# Patient Record
Sex: Female | Born: 1980 | Hispanic: No | Marital: Married | Smoking: Never smoker
Health system: Southern US, Community
[De-identification: ages and names within clinical notes are randomized; demographics above are authoritative.]

## PROBLEM LIST (undated history)

## (undated) ENCOUNTER — Emergency Department (HOSPITAL_COMMUNITY): Payer: Self-pay

## (undated) DIAGNOSIS — D179 Benign lipomatous neoplasm, unspecified: Secondary | ICD-10-CM

## (undated) DIAGNOSIS — Z789 Other specified health status: Secondary | ICD-10-CM

## (undated) HISTORY — PX: NO PAST SURGERIES: SHX2092

## (undated) HISTORY — DX: Benign lipomatous neoplasm, unspecified: D17.9

---

## 2012-12-29 ENCOUNTER — Other Ambulatory Visit (HOSPITAL_COMMUNITY): Payer: Self-pay | Admitting: Obstetrics and Gynecology

## 2012-12-29 DIAGNOSIS — Z0489 Encounter for examination and observation for other specified reasons: Secondary | ICD-10-CM

## 2012-12-29 LAB — OB RESULTS CONSOLE ANTIBODY SCREEN: ANTIBODY SCREEN: NEGATIVE

## 2012-12-29 LAB — OB RESULTS CONSOLE RPR: RPR: NONREACTIVE

## 2012-12-29 LAB — OB RESULTS CONSOLE HIV ANTIBODY (ROUTINE TESTING): HIV: NONREACTIVE

## 2012-12-29 LAB — OB RESULTS CONSOLE ABO/RH: RH TYPE: POSITIVE

## 2012-12-29 LAB — OB RESULTS CONSOLE RUBELLA ANTIBODY, IGM: RUBELLA: NON-IMMUNE/NOT IMMUNE

## 2012-12-29 LAB — OB RESULTS CONSOLE GC/CHLAMYDIA
Chlamydia: NEGATIVE
Gonorrhea: NEGATIVE

## 2012-12-29 LAB — OB RESULTS CONSOLE HEPATITIS B SURFACE ANTIGEN: Hepatitis B Surface Ag: NEGATIVE

## 2013-01-26 ENCOUNTER — Other Ambulatory Visit (HOSPITAL_COMMUNITY): Payer: Self-pay | Admitting: Obstetrics and Gynecology

## 2013-01-26 ENCOUNTER — Ambulatory Visit (HOSPITAL_COMMUNITY)
Admission: RE | Admit: 2013-01-26 | Discharge: 2013-01-26 | Disposition: A | Discharge status: Home / Self Care | Payer: Medicaid Other | Source: Ambulatory Visit | Attending: Obstetrics and Gynecology | Admitting: Obstetrics and Gynecology

## 2013-01-26 DIAGNOSIS — Z0489 Encounter for examination and observation for other specified reasons: Secondary | ICD-10-CM

## 2013-01-26 DIAGNOSIS — Z3689 Encounter for other specified antenatal screening: Secondary | ICD-10-CM | POA: Insufficient documentation

## 2013-04-13 NOTE — L&D Delivery Note (Signed)
Delivery Note At 9:07 AM a viable and healthy female was delivered via Vaginal, Spontaneous Delivery (Presentation: Left; Occiput Anterior).  APGAR: 9, 9; weight pending.   Placenta status: Intact, Spontaneous.  Cord: 3 vessels with a loose nuchal x 1  Anesthesia: Epidural  Episiotomy: None Lacerations: 2nd degree perineal Suture Repair: 3.0 vicryl & 4-0 vicryl Est. Blood Loss (mL): 400 cc  Mom to postpartum.  Baby to Couplet care / Skin to Skin.  Feven Alderfer H. 06/26/2013, 9:50 AM

## 2013-05-25 LAB — OB RESULTS CONSOLE GBS: GBS: NEGATIVE

## 2013-06-21 ENCOUNTER — Telehealth (HOSPITAL_COMMUNITY): Payer: Self-pay | Admitting: *Deleted

## 2013-06-21 NOTE — Telephone Encounter (Signed)
Preadmission screen  

## 2013-06-22 ENCOUNTER — Encounter (HOSPITAL_COMMUNITY): Payer: Self-pay | Admitting: *Deleted

## 2013-06-23 ENCOUNTER — Telehealth (HOSPITAL_COMMUNITY): Payer: Self-pay | Admitting: *Deleted

## 2013-06-23 NOTE — Telephone Encounter (Signed)
Preadmission screen Interpreter number  [phone]

## 2013-06-25 ENCOUNTER — Inpatient Hospital Stay (HOSPITAL_COMMUNITY)
Admission: RE | Admit: 2013-06-25 | Discharge: 2013-06-28 | DRG: 775 | Disposition: A | Discharge status: Home / Self Care | Payer: Medicaid Other | Source: Ambulatory Visit | Attending: Obstetrics and Gynecology | Admitting: Obstetrics and Gynecology

## 2013-06-25 ENCOUNTER — Inpatient Hospital Stay (HOSPITAL_COMMUNITY)
Admission: RE | Admit: 2013-06-25 | Discharge: 2013-06-25 | Disposition: A | Discharge status: Home / Self Care | Payer: Medicaid Other | Source: Ambulatory Visit | Attending: Obstetrics and Gynecology | Admitting: Obstetrics and Gynecology

## 2013-06-25 ENCOUNTER — Encounter (HOSPITAL_COMMUNITY): Payer: Self-pay | Admitting: *Deleted

## 2013-06-25 DIAGNOSIS — Z3490 Encounter for supervision of normal pregnancy, unspecified, unspecified trimester: Secondary | ICD-10-CM

## 2013-06-25 DIAGNOSIS — O480 Post-term pregnancy: Principal | ICD-10-CM | POA: Diagnosis present

## 2013-06-25 HISTORY — DX: Other specified health status: Z78.9

## 2013-06-25 LAB — CBC
HEMATOCRIT: 35 % — AB (ref 36.0–46.0)
HEMOGLOBIN: 12.1 g/dL (ref 12.0–15.0)
MCH: 32 pg (ref 26.0–34.0)
MCHC: 34.6 g/dL (ref 30.0–36.0)
MCV: 92.6 fL (ref 78.0–100.0)
Platelets: 208 10*3/uL (ref 150–400)
RBC: 3.78 MIL/uL — ABNORMAL LOW (ref 3.87–5.11)
RDW: 13.2 % (ref 11.5–15.5)
WBC: 13 10*3/uL — AB (ref 4.0–10.5)

## 2013-06-25 MED ORDER — LIDOCAINE HCL (PF) 1 % IJ SOLN
30.0000 mL | INTRAMUSCULAR | Status: DC | PRN
  Filled 2013-06-25: qty 30

## 2013-06-25 MED ORDER — OXYTOCIN 40 UNITS IN LACTATED RINGERS INFUSION - SIMPLE MED: 62.5000 mL/h | INTRAVENOUS | Status: DC

## 2013-06-25 MED ORDER — IBUPROFEN 600 MG PO TABS
600.0000 mg | ORAL_TABLET | Freq: Four times a day (QID) | ORAL | Status: DC | PRN
  Administered 2013-06-26: 600 mg via ORAL
  Filled 2013-06-25: qty 1

## 2013-06-25 MED ORDER — BUTORPHANOL TARTRATE 1 MG/ML IJ SOLN
1.0000 mg | INTRAMUSCULAR | Status: DC | PRN
  Administered 2013-06-26: 1 mg via INTRAVENOUS
  Filled 2013-06-25 (×2): qty 1

## 2013-06-25 MED ORDER — ACETAMINOPHEN 325 MG PO TABS: 650.0000 mg | ORAL_TABLET | ORAL | Status: DC | PRN

## 2013-06-25 MED ORDER — EPHEDRINE 5 MG/ML INJ
10.0000 mg | INTRAVENOUS | Status: DC | PRN
  Filled 2013-06-25: qty 4
  Filled 2013-06-25: qty 2

## 2013-06-25 MED ORDER — CITRIC ACID-SODIUM CITRATE 334-500 MG/5ML PO SOLN: 30.0000 mL | ORAL | Status: DC | PRN

## 2013-06-25 MED ORDER — DIPHENHYDRAMINE HCL 50 MG/ML IJ SOLN: 12.5000 mg | INTRAMUSCULAR | Status: DC | PRN

## 2013-06-25 MED ORDER — ZOLPIDEM TARTRATE 5 MG PO TABS
5.0000 mg | ORAL_TABLET | Freq: Once | ORAL | Status: AC
  Administered 2013-06-25: 5 mg via ORAL
  Filled 2013-06-25: qty 1

## 2013-06-25 MED ORDER — EPHEDRINE 5 MG/ML INJ
10.0000 mg | INTRAVENOUS | Status: DC | PRN
  Filled 2013-06-25: qty 2

## 2013-06-25 MED ORDER — PHENYLEPHRINE 40 MCG/ML (10ML) SYRINGE FOR IV PUSH (FOR BLOOD PRESSURE SUPPORT)
80.0000 ug | PREFILLED_SYRINGE | INTRAVENOUS | Status: DC | PRN
  Filled 2013-06-25: qty 2
  Filled 2013-06-25: qty 10

## 2013-06-25 MED ORDER — PHENYLEPHRINE 40 MCG/ML (10ML) SYRINGE FOR IV PUSH (FOR BLOOD PRESSURE SUPPORT)
80.0000 ug | PREFILLED_SYRINGE | INTRAVENOUS | Status: DC | PRN
  Filled 2013-06-25: qty 2

## 2013-06-25 MED ORDER — FLEET ENEMA 7-19 GM/118ML RE ENEM: 1.0000 | ENEMA | RECTAL | Status: DC | PRN

## 2013-06-25 MED ORDER — MISOPROSTOL 25 MCG QUARTER TABLET
25.0000 ug | ORAL_TABLET | ORAL | Status: DC
  Administered 2013-06-25: 25 ug via VAGINAL
  Filled 2013-06-25: qty 0.25

## 2013-06-25 MED ORDER — LACTATED RINGERS IV SOLN
500.0000 mL | Freq: Once | INTRAVENOUS | Status: AC
  Administered 2013-06-26: 500 mL via INTRAVENOUS

## 2013-06-25 MED ORDER — OXYTOCIN BOLUS FROM INFUSION
500.0000 mL | INTRAVENOUS | Status: DC
Start: 2013-06-25 — End: 2013-06-26
  Administered 2013-06-26: 500 mL via INTRAVENOUS

## 2013-06-25 MED ORDER — LACTATED RINGERS IV SOLN
INTRAVENOUS | Status: DC
  Administered 2013-06-26 (×3): via INTRAVENOUS

## 2013-06-25 MED ORDER — LACTATED RINGERS IV SOLN
500.0000 mL | INTRAVENOUS | Status: DC | PRN
  Administered 2013-06-26 (×2): 500 mL via INTRAVENOUS

## 2013-06-25 MED ORDER — ONDANSETRON HCL 4 MG/2ML IJ SOLN
4.0000 mg | Freq: Four times a day (QID) | INTRAMUSCULAR | Status: DC | PRN
  Administered 2013-06-26 (×2): 4 mg via INTRAVENOUS
  Filled 2013-06-25 (×2): qty 2

## 2013-06-25 MED ORDER — OXYCODONE-ACETAMINOPHEN 5-325 MG PO TABS: 1.0000 | ORAL_TABLET | ORAL | Status: DC | PRN

## 2013-06-25 MED ORDER — FENTANYL 2.5 MCG/ML BUPIVACAINE 1/10 % EPIDURAL INFUSION (WH - ANES)
14.0000 mL/h | INTRAMUSCULAR | Status: DC | PRN
  Filled 2013-06-25: qty 125

## 2013-06-25 NOTE — H&P (Signed)
33 y.o. [redacted]w[redacted]d  G1P0 comes in for post dates induction.  Otherwise has good fetal movement and no bleeding.  Past Medical History  Diagnosis Date  . Medical history non-contributory     Past Surgical History  Procedure Laterality Date  . No past surgeries      OB History  Gravida Para Term Preterm AB SAB TAB Ectopic Multiple Living  1             # Outcome Date GA Lbr Len/2nd Weight Sex Delivery Anes PTL Lv  1 CUR               History   Social History  . Marital Status: Unknown    Spouse Name: N/A    Number of Children: N/A  . Years of Education: N/A   Occupational History  . Not on file.   Social History Main Topics  . Smoking status: Never Smoker   . Smokeless tobacco: Not on file  . Alcohol Use: No  . Drug Use: No  . Sexual Activity: Yes   Other Topics Concern  . Not on file   Social History Narrative  . No narrative on file   Review of patient's allergies indicates no known allergies.    Prenatal Transfer Tool  Maternal Diabetes: No Genetic Screening: Declined Maternal Ultrasounds/Referrals: Normal Fetal Ultrasounds or other Referrals:  None Maternal Substance Abuse:  No Significant Maternal Medications:  None Significant Maternal Lab Results: None  Other PNC: uncomplicated.    Filed Vitals:   06/25/13 1933  BP: 123/81  Pulse: 94  Temp: 97.8 F (36.6 C)  Resp: 18     Lungs/Cor:  NAD Abdomen:  soft, gravid Ex:  no cords, erythema SVE:  0/0/high per nurse FHTs:  130, good STV, NST R Toco:  qocc   A/P   Post dates induction with cytotec  GBS Neg.  Teddy Pena A

## 2013-06-26 ENCOUNTER — Inpatient Hospital Stay (HOSPITAL_COMMUNITY): Payer: Medicaid Other | Admitting: Anesthesiology

## 2013-06-26 ENCOUNTER — Encounter (HOSPITAL_COMMUNITY): Payer: Medicaid Other | Admitting: Anesthesiology

## 2013-06-26 ENCOUNTER — Encounter (HOSPITAL_COMMUNITY): Payer: Self-pay | Admitting: *Deleted

## 2013-06-26 LAB — RPR: RPR: NONREACTIVE

## 2013-06-26 LAB — ABO/RH: ABO/RH(D): O POS

## 2013-06-26 MED ORDER — ONDANSETRON HCL 4 MG PO TABS: 4.0000 mg | ORAL_TABLET | ORAL | Status: DC | PRN

## 2013-06-26 MED ORDER — ZOLPIDEM TARTRATE 5 MG PO TABS: 5.0000 mg | ORAL_TABLET | Freq: Every evening | ORAL | Status: DC | PRN

## 2013-06-26 MED ORDER — SIMETHICONE 80 MG PO CHEW
80.0000 mg | CHEWABLE_TABLET | ORAL | Status: DC | PRN
  Filled 2013-06-26: qty 1

## 2013-06-26 MED ORDER — FENTANYL 2.5 MCG/ML BUPIVACAINE 1/10 % EPIDURAL INFUSION (WH - ANES)
INTRAMUSCULAR | Status: DC | PRN
  Administered 2013-06-26: 14 mL/h via EPIDURAL

## 2013-06-26 MED ORDER — DIBUCAINE 1 % RE OINT
1.0000 | TOPICAL_OINTMENT | RECTAL | Status: DC | PRN
Start: 2013-06-26 — End: 2013-06-28

## 2013-06-26 MED ORDER — BENZOCAINE-MENTHOL 20-0.5 % EX AERO: 1.0000 "application " | INHALATION_SPRAY | CUTANEOUS | Status: DC | PRN

## 2013-06-26 MED ORDER — WITCH HAZEL-GLYCERIN EX PADS: 1.0000 "application " | MEDICATED_PAD | CUTANEOUS | Status: DC | PRN

## 2013-06-26 MED ORDER — TETANUS-DIPHTH-ACELL PERTUSSIS 5-2.5-18.5 LF-MCG/0.5 IM SUSP: 0.5000 mL | Freq: Once | INTRAMUSCULAR | Status: DC

## 2013-06-26 MED ORDER — ONDANSETRON HCL 4 MG/2ML IJ SOLN: 4.0000 mg | INTRAMUSCULAR | Status: DC | PRN

## 2013-06-26 MED ORDER — FENTANYL 2.5 MCG/ML BUPIVACAINE 1/10 % EPIDURAL INFUSION (WH - ANES): 14.0000 mL/h | INTRAMUSCULAR | Status: DC | PRN

## 2013-06-26 MED ORDER — OXYCODONE-ACETAMINOPHEN 5-325 MG PO TABS: 1.0000 | ORAL_TABLET | ORAL | Status: DC | PRN

## 2013-06-26 MED ORDER — SENNOSIDES-DOCUSATE SODIUM 8.6-50 MG PO TABS
2.0000 | ORAL_TABLET | ORAL | Status: DC
  Administered 2013-06-27 – 2013-06-28 (×2): 2 via ORAL
  Filled 2013-06-26 (×2): qty 2

## 2013-06-26 MED ORDER — TERBUTALINE SULFATE 1 MG/ML IJ SOLN: 0.2500 mg | Freq: Once | INTRAMUSCULAR | Status: DC | PRN

## 2013-06-26 MED ORDER — IBUPROFEN 600 MG PO TABS
600.0000 mg | ORAL_TABLET | Freq: Four times a day (QID) | ORAL | Status: DC
  Administered 2013-06-26 – 2013-06-28 (×8): 600 mg via ORAL
  Filled 2013-06-26 (×8): qty 1

## 2013-06-26 MED ORDER — PRENATAL MULTIVITAMIN CH
1.0000 | ORAL_TABLET | Freq: Every day | ORAL | Status: DC
  Administered 2013-06-27 – 2013-06-28 (×2): 1 via ORAL
  Filled 2013-06-26 (×2): qty 1

## 2013-06-26 MED ORDER — METHYLERGONOVINE MALEATE 0.2 MG/ML IJ SOLN: 0.2000 mg | INTRAMUSCULAR | Status: DC | PRN

## 2013-06-26 MED ORDER — METHYLERGONOVINE MALEATE 0.2 MG PO TABS: 0.2000 mg | ORAL_TABLET | ORAL | Status: DC | PRN

## 2013-06-26 MED ORDER — OXYTOCIN 40 UNITS IN LACTATED RINGERS INFUSION - SIMPLE MED
1.0000 m[IU]/min | INTRAVENOUS | Status: DC
  Filled 2013-06-26: qty 1000

## 2013-06-26 MED ORDER — DIPHENHYDRAMINE HCL 25 MG PO CAPS: 25.0000 mg | ORAL_CAPSULE | Freq: Four times a day (QID) | ORAL | Status: DC | PRN

## 2013-06-26 MED ORDER — LANOLIN HYDROUS EX OINT: TOPICAL_OINTMENT | CUTANEOUS | Status: DC | PRN

## 2013-06-26 MED ORDER — LIDOCAINE HCL (PF) 1 % IJ SOLN
INTRAMUSCULAR | Status: DC | PRN
  Administered 2013-06-26 (×2): 4 mL

## 2013-06-26 NOTE — Lactation Note (Signed)
This note was copied from the chart of Roberta Alenna Russell. Lactation Consultation Note Follow up consult:  Baby STS after bath. Mother hand expressed good flow of colostrum. Mother prepumped with hand pump to evert nipple on left side. Mother cramping with pumping. Nipple everted slightly, attempted to latch baby but unable to sustain latch. Repostioned baby in cross cradle hold and baby unable to latch with compression. Introduced #24 NS.  Seemed too large for baby's mouth. Introduced #20 NS, baby latched, rhythmical sucking observed. Colostrum viewed in NS.  Reviewed cleaning and use of NS and depth of latch. Reviewed size of baby's stomach and cluster feeding. After 10 min of sucking with #20 NS, attempted to relatch baby without NS. Baby unable to stayed latched but encouraged parents to continue to try without the shield. Reassured parents and encouraged them to call for further assistance.    Patient Name: Roberta Valenzuela [id number] Date: 06/26/2013 Reason for consult: Follow-up assessment   Maternal Data    Feeding Feeding Type: Breast Fed  LATCH Score/Interventions Latch: Repeated attempts needed to sustain latch, nipple held in mouth throughout feeding, stimulation needed to elicit sucking reflex. Intervention(s): Skin to skin;Teach feeding cues Intervention(s): Adjust position;Assist with latch;Breast massage  Audible Swallowing: A few with stimulation Intervention(s): Skin to skin;Hand expression  Type of Nipple: Everted at rest and after stimulation (semi flat, partially inverted) Intervention(s): Hand pump;Shells Intervention(s): Reverse pressure;Hand pump  Comfort (Breast/Nipple): Soft / non-tender     Hold (Positioning): Assistance needed to correctly position infant at breast and maintain latch. Intervention(s): Breastfeeding basics reviewed;Support Pillows;Position options  LATCH Score: 7  Lactation Tools Discussed/Used Tools: Shells;Nipple Shields Nipple  shield size: 20 Breast pump type: Manual   Consult Status Consult Status: Follow-up Date: 06/27/13 Follow-up type: In-patient    Roberta Valenzuela [facility] 06/26/2013, 9:43 PM

## 2013-06-26 NOTE — Transfer of Care (Signed)
Immediate Anesthesia Transfer of Care Note  Patient: Roberta Valenzuela  Procedure(s) Performed: * No procedures listed *  Patient Location: PACU and Mother/Baby  Anesthesia Type:Epidural  Level of Consciousness: awake, alert , oriented and patient cooperative  Airway & Oxygen Therapy: Patient Spontanous Breathing  Post-op Assessment: Report given to PACU RN, Post -op Vital signs reviewed and stable and Patient moving all extremities X 4  Post vital signs: Reviewed and stable  Complications: No apparent anesthesia complications

## 2013-06-26 NOTE — Lactation Note (Signed)
This note was copied from the chart of Roberta Denis Koppel. Lactation Consultation Note  Patient Name: Roberta Valenzuela [id number] Date: 06/26/2013 Reason for consult: Initial assessment At consult reviewed basics - breast massage , hand express, , small drops of colostrum.  Latched on the right side  For 15 mins with swallows. Released and the nipple appeared normal. 2 - stool diaper changed by LC , Left nipple semi flat , semi inverted , With breast massage, hand express, prepump with a hand pump, latched after several attempts with swallows and still breast feeding with depth @ 10 mins. Mom asking for formula during consult , expressing feelings she doesn't have milk . LC discussed 1st milk colostrum , and showed mom how to hand express. Few drops before latch and after on the right several medium to large drops noted , also discussed the size of the baby's stomach. Also discussed holding off  on a pacifier for 3 weeks and reasons why . [place] demo the hand pump and shells and showed dad how to clean the hand pump. Mom and dad aware of the [facility] and the [facility] O/P services.     Maternal Data Formula Feeding for Exclusion: No Infant to breast within first hour of birth: Yes (attempted , no success with latch ) Has patient been taught Hand Expression?: Yes (several  small to medium drops , more on right then left ) Does the patient have breastfeeding experience prior to this delivery?: No  Feeding Feeding Type: Breast Fed Length of feed:  (10 mins and still feeding with swallows )  LATCH Score/Interventions Latch: Repeated attempts needed to sustain latch, nipple held in mouth throughout feeding, stimulation needed to elicit sucking reflex. Intervention(s): Skin to skin;Teach feeding cues;Waking techniques Intervention(s): Adjust position;Assist with latch;Breast massage;Breast compression  Audible Swallowing: A few with stimulation  Type of Nipple: Flat (semi inverted, improved with massage,  pre-pumping )  Comfort (Breast/Nipple): Soft / non-tender     Hold (Positioning): Assistance needed to correctly position infant at breast and maintain latch. Intervention(s): Breastfeeding basics reviewed;Support Pillows;Position options;Skin to skin  LATCH Score: 6  Lactation Tools Discussed/Used Tools: Shells;Pump Shell Type: Inverted Breast pump type: Manual Pump Review: Setup, frequency, and cleaning Initiated by:: MAI  Date initiated:: 06/26/13   Consult Status Consult Status: Follow-up Date: 06/27/13 Follow-up type: In-patient    Myer Haff 06/26/2013, 4:03 PM

## 2013-06-26 NOTE — Anesthesia Preprocedure Evaluation (Signed)
Anesthesia Evaluation  Patient identified by MRN, date of birth, ID band Patient awake    Reviewed: Allergy & Precautions, H&P , NPO status , Patient's Chart, lab work & pertinent test results  Airway Mallampati: II TM Distance: >3 FB     Dental   Pulmonary neg pulmonary ROS,          Cardiovascular negative cardio ROS  Rhythm:Regular     Neuro/Psych negative neurological ROS  negative psych ROS   GI/Hepatic negative GI ROS, Neg liver ROS,   Endo/Other  negative endocrine ROS  Renal/GU negative Renal ROS     Musculoskeletal negative musculoskeletal ROS (+)   Abdominal   Peds  Hematology negative hematology ROS (+)   Anesthesia Other Findings   Reproductive/Obstetrics (+) Pregnancy                           Anesthesia Physical Anesthesia Plan  ASA: II  Anesthesia Plan: Epidural   Post-op Pain Management:    Induction:   Airway Management Planned:   Additional Equipment:   Intra-op Plan:   Post-operative Plan:   Informed Consent: I have reviewed the patients History and Physical, chart, labs and discussed the procedure including the risks, benefits and alternatives for the proposed anesthesia with the patient or authorized representative who has indicated his/her understanding and acceptance.     Plan Discussed with:   Anesthesia Plan Comments:         Anesthesia Quick Evaluation  

## 2013-06-26 NOTE — Anesthesia Procedure Notes (Signed)
Epidural Patient location during procedure: OB Start time: 06/26/2013 3:55 AM End time: 06/26/2013 4:05 AM  Staffing Anesthesiologist: Nolon Nations R Performed by: anesthesiologist   Preanesthetic Checklist Completed: patient identified, pre-op evaluation, timeout performed, IV checked, risks and benefits discussed and monitors and equipment checked  Epidural Patient position: sitting Prep: site prepped and draped and DuraPrep Patient monitoring: heart rate Approach: midline Location: L2-L3 Injection technique: LOR air and LOR saline  Needle:  Needle type: Tuohy  Needle gauge: 17 G Needle length: 9 cm Needle insertion depth: 6 cm Catheter type: closed end flexible Catheter size: 19 Gauge Catheter at skin depth: 12 cm Test dose: negative  Assessment Sensory level: T8 Events: blood not aspirated, injection not painful, no injection resistance, negative IV test and no paresthesia  Additional Notes Reason for block:procedure for pain

## 2013-06-27 LAB — CBC
HCT: 31.9 % — ABNORMAL LOW (ref 36.0–46.0)
HEMOGLOBIN: 10.9 g/dL — AB (ref 12.0–15.0)
MCH: 32.1 pg (ref 26.0–34.0)
MCHC: 34.2 g/dL (ref 30.0–36.0)
MCV: 93.8 fL (ref 78.0–100.0)
Platelets: 204 10*3/uL (ref 150–400)
RBC: 3.4 MIL/uL — ABNORMAL LOW (ref 3.87–5.11)
RDW: 13.5 % (ref 11.5–15.5)
WBC: 16.2 10*3/uL — AB (ref 4.0–10.5)

## 2013-06-27 NOTE — Progress Notes (Signed)
Patient is eating, ambulating, voiding.  Pain control is good.  Appropriate lochia.  No complaints.   Filed Vitals:   06/26/13 1200 06/26/13 1615 06/27/13 0039 06/27/13 0640  BP: 114/71 99/63 101/52 110/72  Pulse: 81 82 89 85  Temp: 98 F (36.7 C) 98.3 F (36.8 C) 98.3 F (36.8 C) 98.3 F (36.8 C)  TempSrc: Oral Oral  Oral  Resp: 18 16 18 18   Height:      Weight:      SpO2:        Fundus firm Perineum without swelling. No Ct  Lab Results  Component Value Date   WBC 16.2* 06/27/2013   HGB 10.9* 06/27/2013   HCT 31.9* 06/27/2013   MCV 93.8 06/27/2013   PLT 204 06/27/2013    --/--/O POS (03/15 1940)  A/P Post partum day 1.  Routine care.  Expect d/c 3/18.    Allyn Kenner

## 2013-06-28 MED ORDER — MEASLES, MUMPS & RUBELLA VAC ~~LOC~~ INJ
0.5000 mL | INJECTION | Freq: Once | SUBCUTANEOUS | Status: DC
  Filled 2013-06-28: qty 0.5

## 2013-06-28 NOTE — Progress Notes (Signed)
Patient is eating, ambulating, voiding.  Pain control is good.  Filed Vitals:   06/27/13 0039 06/27/13 0640 06/27/13 1725 06/28/13 0600  BP: 101/52 110/72 106/71 98/65  Pulse: 89 85 88 75  Temp: 98.3 F (36.8 C) 98.3 F (36.8 C) 98 F (36.7 C) 98.6 F (37 C)  TempSrc:  Oral Oral Oral  Resp: 18 18 18 18   Height:      Weight:      SpO2:        Fundus firm Perineum without swelling.  Lab Results  Component Value Date   WBC 16.2* 06/27/2013   HGB 10.9* 06/27/2013   HCT 31.9* 06/27/2013   MCV 93.8 06/27/2013   PLT 204 06/27/2013    --/--/O POS (03/15 1940)/RNI  A/P Post partum day 2.  Routine care.  Expect d/c today.  Rubella vaccine given.  Jalisha Enneking A

## 2013-06-28 NOTE — Discharge Summary (Signed)
Obstetric Discharge Summary Reason for Admission: induction of labor Prenatal Procedures: none Intrapartum Procedures: spontaneous vaginal delivery Postpartum Procedures: none and Rubella Ig Complications-Operative and Postpartum: 2 degree perineal laceration Hemoglobin  Date Value Ref Range Status  06/27/2013 10.9* 12.0 - 15.0 g/dL Final     HCT  Date Value Ref Range Status  06/27/2013 31.9* 36.0 - 46.0 % Final     Discharge Diagnoses: Term Pregnancy-delivered  Discharge Information: Date: 06/28/2013 Activity: pelvic rest Diet: routine Medications: Ibuprofen and Iron Condition: stable Instructions: refer to practice specific booklet Discharge to: home Follow-up Information   Follow up with Marcial Pacas., MD.   Specialty:  Obstetrics and Gynecology   Contact information:   [address] [place] [address] [phone]       Newborn Data: Live born female  Birth Weight: 6 lb 9.6 oz (2995 g) APGAR: 9, 9  Home with mother.  Roberta Valenzuela A 06/28/2013, 7:39 AM

## 2013-06-28 NOTE — Plan of Care (Signed)
Problem: Discharge Progression Outcomes Goal: MMR given as ordered Outcome: Not Applicable Date Met:  76/81/15 Patient refused vaccine

## 2014-02-12 ENCOUNTER — Encounter (HOSPITAL_COMMUNITY): Payer: Self-pay | Admitting: *Deleted

## 2014-04-28 ENCOUNTER — Encounter (HOSPITAL_COMMUNITY): Payer: Self-pay | Admitting: Cardiology

## 2014-04-28 ENCOUNTER — Emergency Department (HOSPITAL_COMMUNITY)
Admission: EM | Admit: 2014-04-28 | Discharge: 2014-04-28 | Disposition: A | Discharge status: Home / Self Care | Payer: Medicaid Other | Attending: Emergency Medicine | Admitting: Emergency Medicine

## 2014-04-28 DIAGNOSIS — D170 Benign lipomatous neoplasm of skin and subcutaneous tissue of head, face and neck: Secondary | ICD-10-CM

## 2014-04-28 DIAGNOSIS — Z79899 Other long term (current) drug therapy: Secondary | ICD-10-CM | POA: Insufficient documentation

## 2014-04-28 DIAGNOSIS — Z792 Long term (current) use of antibiotics: Secondary | ICD-10-CM | POA: Insufficient documentation

## 2014-04-28 MED ORDER — OXYCODONE-ACETAMINOPHEN 5-325 MG PO TABS: 1.0000 | ORAL_TABLET | ORAL | Status: DC | PRN

## 2014-04-28 MED ORDER — OXYCODONE-ACETAMINOPHEN 5-325 MG PO TABS
1.0000 | ORAL_TABLET | Freq: Once | ORAL | Status: AC
  Administered 2014-04-28: 1 via ORAL
  Filled 2014-04-28: qty 1

## 2014-04-28 NOTE — Discharge Instructions (Signed)
Please follow up with your primary care physician in 1-2 days. If you do not have one please call the [place] number listed above. Please follow up with [facility] to schedule a follow up appointment.  Please take pain medication and/or muscle relaxants as prescribed and as needed for pain. Please do not drive on narcotic pain medication or on muscle relaxants. Please read all discharge instructions and return precautions.    Lipoma A lipoma is a noncancerous (benign) tumor composed of fat cells. They are usually found under the skin (subcutaneous). A lipoma may occur in any tissue of the body that contains fat. Common areas for lipomas to appear include the back, shoulders, buttocks, and thighs. Lipomas are a very common soft tissue growth. They are soft and grow slowly. Most problems caused by a lipoma depend on where it is growing. DIAGNOSIS  A lipoma can be diagnosed with a physical exam. These tumors rarely become cancerous, but radiographic studies can help determine this for certain. Studies used may include:  Computerized X-ray scans (CT or CAT scan).  Computerized magnetic scans (MRI). TREATMENT  Small lipomas that are not causing problems may be watched. If a lipoma continues to enlarge or causes problems, removal is often the best treatment. Lipomas can also be removed to improve appearance. Surgery is done to remove the fatty cells and the surrounding capsule. Most often, this is done with medicine that numbs the area (local anesthetic). The removed tissue is examined under a microscope to make sure it is not cancerous. Keep all follow-up appointments with your caregiver. SEEK MEDICAL CARE IF:   The lipoma becomes larger or hard.  The lipoma becomes painful, red, or increasingly swollen. These could be signs of infection or a more serious condition. Document Released: 03/20/2002 Document Revised: 06/22/2011 Document Reviewed: 08/30/2009 [facility]  Patient Information 2015 [place], [place]. This information is not intended to replace advice given to you by your health care provider. Make sure you discuss any questions you have with your health care provider.

## 2014-04-28 NOTE — ED Notes (Signed)
Pt reports she noticed an abscess to the back of her neck a couple of months ago. Reports the area has continued to get larger since then. States pain with movement and down into her back.

## 2014-04-28 NOTE — ED Notes (Signed)
Pt. Reporting "bump" on neck x1 year. States it started really small but has progressively gotten bigger. Denies drainage or redness. Does reports some numbness/tingling to left shoulder/arm and increase in pain

## 2014-04-28 NOTE — ED Provider Notes (Signed)
CSN: [id number]     Arrival date & time 04/28/14  1230 History   First MD Initiated Contact with Patient 04/28/14 1609     Chief Complaint  Patient presents with  . Abscess     (Consider location/radiation/quality/duration/timing/severity/associated sxs/prior Treatment) HPI Comments: Patient is a 34 yo F presenting to the ED for evaluation of a gradually enlarging "bump" to the back of her neck over the last year. Patient states the area can be painful at times and is causing radiation to her shoulder. She denies any numbness or tingling during my evaluation down her left arm. Patient denies that the area has been red or hot or had any drainage. She has not been evaluated for this in the past year. No modifying factors identified. Denies any fevers, chills, headache.  Patient is a 34 y.o. female presenting with abscess.  Abscess   Past Medical History  Diagnosis Date  . Medical history non-contributory    Past Surgical History  Procedure Laterality Date  . No past surgeries     Family History  Problem Relation Age of Onset  . Diabetes Mother    History  Substance Use Topics  . Smoking status: Never Smoker   . Smokeless tobacco: Not on file  . Alcohol Use: No   OB History    Gravida Para Term Preterm AB TAB SAB Ectopic Multiple Living   1 1 1       1      Review of Systems  Musculoskeletal: Positive for neck pain.  All other systems reviewed and are negative.     Allergies  Review of patient's allergies indicates no known allergies.  Home Medications   Prior to Admission medications   Medication Sig Start Date End Date Taking? Authorizing Provider  clindamycin (CLINDAGEL) 1 % gel Apply 1 application topically 2 (two) times daily.    Historical Provider, MD  ondansetron (ZOFRAN) 8 MG tablet Take 8 mg by mouth every 8 (eight) hours as needed for nausea or vomiting.    Historical Provider, MD  oxyCODONE-acetaminophen (PERCOCET/ROXICET) 5-325 MG per tablet Take 1  tablet by mouth every 4 (four) hours as needed for severe pain. May take 2 tablets PO q 6 hours for severe pain - Do not take with Tylenol as this tablet already contains tylenol 04/28/14   Stephani Police Charmel Pronovost, PA-C  Prenatal Vit-Fe Fumarate-FA (PRENATAL MULTIVITAMIN) TABS tablet Take 1 tablet by mouth daily at 12 noon.    Historical Provider, MD   BP 114/72 mmHg  Pulse 69  Temp(Src) 98.3 F (36.8 C) (Oral)  Resp 18  Wt 179 lb (81.194 kg)  SpO2 100% Physical Exam  Constitutional: She is oriented to person, place, and time. She appears well-developed and well-nourished. No distress.  HENT:  Head: Normocephalic and atraumatic.  Right Ear: External ear normal.  Left Ear: External ear normal.  Nose: Nose normal.  Mouth/Throat: Oropharynx is clear and moist.  Eyes: Conjunctivae are normal.  Neck: Normal range of motion. Neck supple.  3 cm flesh colored mobile cyst. No fluctuance, induration, erythema, warmth.  Cardiovascular: Normal rate, regular rhythm and normal heart sounds.   Pulmonary/Chest: Effort normal and breath sounds normal.  Abdominal: Soft.  Musculoskeletal: Normal range of motion.  Neurological: She is alert and oriented to person, place, and time.  Sensation grossly intact. Moves all extremities without ataxia.  Skin: Skin is warm and dry. She is not diaphoretic.  Psychiatric: She has a normal mood and affect.  Nursing note and  vitals reviewed.   ED Course  Procedures (including critical care time) Medications  oxyCODONE-acetaminophen (PERCOCET/ROXICET) 5-325 MG per tablet 1 tablet (1 tablet Oral Given 04/28/14 1651)    Labs Review Labs Reviewed - No data to display  Imaging Review No results found.   EKG Interpretation None      MDM   Final diagnoses:  Lipoma of neck    Filed Vitals:   04/28/14 1652  BP: 114/72  Pulse:   Temp:   Resp:    Afebrile, NAD, non-toxic appearing, AAOx4. Patient with a mobile cyst to posterior neck. No erythema,  warmth, fluctuance, or induration to suggest abscess or deep tissue infection. Will treat pain and symptoms. Advised general surgery follow up for removal. Patient d/w with Dr. Regenia Skeeter, agrees with plan.      Harlow Mares, PA-C 04/29/14 0031  Ephraim Hamburger, MD 04/29/14 [phone]

## 2014-06-08 ENCOUNTER — Ambulatory Visit (INDEPENDENT_AMBULATORY_CARE_PROVIDER_SITE_OTHER): Payer: Self-pay | Admitting: Surgery

## 2014-06-08 NOTE — H&P (Signed)
History of Present Illness Roberta Valenzuela. Arius Harnois MD; 06/08/2014 9:46 AM) Patient words: lipoma back of neck.  The patient is a 34 year old female who presents with a complaint of Mass. Referred by [facility] ED for evaluation of posterior neck mass. The patient is a healthy 34 yo female who presents with a five year history of a slowly enlarging mass on the posterior neck. This has become fairly large and is causing some discomfort. She gets occasional pain radiating down her back and towards her shoulder. No sign of infection. No previous imaging. She presents now to discuss excision.  Other Problems (Ammie Eversole, LPN; 2/50/5397 6:73 AM) Back Pain  Past Surgical History (Ammie Eversole, LPN; 07/30/3788 2:40 AM) No pertinent past surgical history  Diagnostic Studies History (Ammie Eversole, LPN; 9/73/5329 9:24 AM) Colonoscopy never Pap Smear never  Allergies (Ammie Eversole, LPN; 2/68/3419 6:22 AM) No Known Drug Allergies 06/08/2014  Medication History (Ammie Eversole, LPN; 2/97/9892 1:19 AM) Percocet (5-325MG  Tablet, Oral) Active.  Social History (Ammie Eversole, LPN; 07/29/4079 4:48 AM) Caffeine use Coffee. No alcohol use No drug use Tobacco use Never smoker.  Family History Aleatha Borer, LPN; 1/85/6314 9:70 AM) Hypertension Mother.  Pregnancy / Birth History Aleatha Borer, LPN; 2/63/7858 8:50 AM) Age at menarche 59 years. Gravida 1 Maternal age 26-35 Regular periods     Review of Systems (Ammie Eversole LPN; 2/77/4128 7:86 AM) General Not Present- Appetite Loss, Chills, Fatigue, Fever, Night Sweats, Weight Gain and Weight Loss. Skin Not Present- Change in Wart/Mole, Dryness, Hives, Jaundice, New Lesions, Non-Healing Wounds, Rash and Ulcer. HEENT Not Present- Earache, Hearing Loss, Hoarseness, Nose Bleed, Oral Ulcers, Ringing in the Ears, Seasonal Allergies, Sinus Pain, Sore Throat, Visual Disturbances, Wears glasses/contact lenses and Yellow  Eyes. Respiratory Not Present- Bloody sputum, Chronic Cough, Difficulty Breathing, Snoring and Wheezing. Breast Not Present- Breast Mass, Breast Pain, Nipple Discharge and Skin Changes. Cardiovascular Not Present- Chest Pain, Difficulty Breathing Lying Down, Leg Cramps, Palpitations, Rapid Heart Rate, Shortness of Breath and Swelling of Extremities. Gastrointestinal Not Present- Abdominal Pain, Bloating, Bloody Stool, Change in Bowel Habits, Chronic diarrhea, Constipation, Difficulty Swallowing, Excessive gas, Gets full quickly at meals, Hemorrhoids, Indigestion, Nausea, Rectal Pain and Vomiting. Female Genitourinary Not Present- Frequency, Nocturia, Painful Urination, Pelvic Pain and Urgency. Musculoskeletal Not Present- Back Pain, Joint Pain, Joint Stiffness, Muscle Pain, Muscle Weakness and Swelling of Extremities. Neurological Not Present- Decreased Memory, Fainting, Headaches, Numbness, Seizures, Tingling, Tremor, Trouble walking and Weakness. Psychiatric Not Present- Anxiety, Bipolar, Change in Sleep Pattern, Depression, Fearful and Frequent crying. Endocrine Not Present- Cold Intolerance, Excessive Hunger, Hair Changes, Heat Intolerance, Hot flashes and New Diabetes. Hematology Not Present- Easy Bruising, Excessive bleeding, Gland problems, HIV and Persistent Infections.  Vitals (Ammie Eversole LPN; 7/67/2094 7:09 AM) 06/08/2014 9:31 AM Weight: 173.6 lb Height: 64in Body Surface Area: 1.89 m Body Mass Index: 29.8 kg/m Temp.: 97.19F(Oral)  Pulse: 82 (Regular)  BP: 110/82 (Sitting, Left Arm, Standard)     Physical Exam Rodman Key K. Lyell Clugston MD; 06/08/2014 9:47 AM)  The physical exam findings are as follows: Note:WDWN in NAD HEENT: EOMI, sclera anicteric Neck: No thyromegaly; posterior neck - protruding 3 cm subcutaneous mass - no overlying infection; no drainage; smooth, well-demarcated Lungs: CTA bilaterally; normal respiratory effort CV: Regular rate and rhythm; no  murmurs Abd: +bowel sounds, soft, non-tender, no masses Ext: Well-perfused; no edema Skin: Warm, dry; no sign of jaundice    Assessment & Plan Rodman Key K. 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 The surgical procedure has been discussed with the patient. Potential risks, benefits, alternative treatments, and expected outcomes have been explained. All of the patient's questions at this time have been answered. The likelihood of reaching the patient's treatment goal is good. The patient understand the proposed surgical procedure and wishes to proceed.   Roberta Valenzuela. Georgette Dover, MD, [facility] Surgery  General/ Trauma Surgery  06/08/2014 9:47 AM

## 2014-12-11 ENCOUNTER — Ambulatory Visit (INDEPENDENT_AMBULATORY_CARE_PROVIDER_SITE_OTHER): Payer: BLUE CROSS/BLUE SHIELD | Admitting: Physician Assistant

## 2014-12-11 VITALS — BP 96/70 | HR 70 | Temp 98.4°F | Resp 16 | Ht 63.0 in | Wt 166.4 lb

## 2014-12-11 DIAGNOSIS — L309 Dermatitis, unspecified: Secondary | ICD-10-CM | POA: Diagnosis not present

## 2014-12-11 MED ORDER — CEFTRIAXONE SODIUM 1 G IJ SOLR: 250.0000 mg | Freq: Once | INTRAMUSCULAR | Status: DC

## 2014-12-11 MED ORDER — PREDNISONE 10 MG PO TABS: ORAL_TABLET | ORAL | Status: AC

## 2014-12-11 MED ORDER — CEFTRIAXONE SODIUM 250 MG IJ SOLR: 250.0000 mg | Freq: Once | INTRAMUSCULAR | Status: DC

## 2014-12-11 NOTE — Progress Notes (Signed)
   Roberta Valenzuela  MRN: [id number] DOB: 1980/06/21  Subjective:  Pt presents to clinic with itchy rash on her face and ears and arms that started yesterday am when she woke up and it seems to be getting worse - her ears are the worst and very itchy - and then her arms and axillary area which is worse on the right side.  The patient used nothing different - no new soaps, lotions, detergents or sun screens - [place] was not exposed to anything new that she knows of.  No new medications.  No new foods.  No one at home has a similar rash.  She has used nothing OTC to help with the rash.  There are no active problems to display for this patient.   No current outpatient prescriptions on file prior to visit.   No current facility-administered medications on file prior to visit.    No Known Allergies  Review of Systems  Constitutional: Negative for fever and chills.  Skin: Positive for rash.   Objective:  BP 96/70 mmHg  Pulse 70  Temp(Src) 98.4 F (36.9 C) (Oral)  Resp 16  Ht 5\' 3"  (1.6 m)  Wt 166 lb 6.4 oz (75.479 kg)  BMI 29.48 kg/m2  SpO2 98%  LMP 12/09/2014  Physical Exam  Constitutional: She is oriented to person, place, and time and well-developed, well-nourished, and in no distress.  HENT:  Head: Normocephalic and atraumatic.  Right Ear: Hearing and external ear normal.  Left Ear: Hearing and external ear normal.  Eyes: Conjunctivae are normal.  Neck: Normal range of motion.  Cardiovascular: Normal rate, regular rhythm and normal heart sounds.   No murmur heard. Pulmonary/Chest: Effort normal and breath sounds normal. She has no wheezes.  Neurological: She is alert and oriented to person, place, and time. Gait normal.  Skin: Skin is warm and dry. Rash noted. Rash is maculopapular (rash on face and neck and arms and axilla - none on trunk or legs - rash is confluent on the ears). Rash is not nodular, not pustular and not vesicular.  Psychiatric: Mood,  memory, affect and judgment normal.  Vitals reviewed.   Assessment and Plan :  Dermatitis - Plan: predniSONE (DELTASONE) 10 MG tablet - ok to use zyrtec OTC to help with itching - she should avoid warm situations which will make her itching worse - this appears to be a contact dermatitis - if it gets worse she will recheck with me.  Windell Hummingbird PA-C  [facility] 12/11/2014 1:08 PM

## 2014-12-11 NOTE — Patient Instructions (Signed)
Zyrtec - please pick some up for itching  Hot water and hot air will make the itching worse - so be aware with hot showers and doing things outside for the next several days may make your itching worse.  If you are not getting better by Thursday even please recheck with me.

## 2014-12-21 ENCOUNTER — Ambulatory Visit (INDEPENDENT_AMBULATORY_CARE_PROVIDER_SITE_OTHER): Payer: BLUE CROSS/BLUE SHIELD | Admitting: Physician Assistant

## 2014-12-21 VITALS — BP 114/76 | HR 69 | Temp 98.4°F | Resp 16 | Ht 63.0 in | Wt 165.4 lb

## 2014-12-21 DIAGNOSIS — L309 Dermatitis, unspecified: Secondary | ICD-10-CM

## 2014-12-21 MED ORDER — PREDNISONE 10 MG PO TABS: ORAL_TABLET | ORAL | Status: AC

## 2014-12-21 NOTE — Progress Notes (Signed)
Subjective:     Patient ID: Roberta Valenzuela, female   DOB: May 06, 1980, 34 y.o.   MRN: [id number] No PCP Per Patient  Chief Complaint  Patient presents with  . Follow-up    rash on both arms  . Immunizations    would like flu vaccine     HPI Patient is a 34 yo F who presents for evaluation of a rash. She was seen on 8/30 for this rash and given Prednisone and is now returning because the rash has not gone away. The rash completely cleared on Prednisone and returned once she stopped the medication. The rash and swelling on her face and ears has improved, but the rash persists on her arms and occasionally on her legs. It is itchy, and worse when she gets hot and sweaty. It is worse when she comes into contact with water in the shower. No new soaps, lotions, detergents, or environmental exposures. No new medications. No one at home has a similar rash.   There are no active problems to display for this patient.   No current outpatient prescriptions on file.   No Known Allergies   Review of Systems  Constitutional: Negative for fever and chills.  Skin: Positive for rash.  Allergic/Immunologic: Negative for environmental allergies and food allergies.        Objective:   Physical Exam  Constitutional: She is oriented to person, place, and time. She appears well-developed and well-nourished.  HENT:  Head: Normocephalic and atraumatic.  Cardiovascular: Normal rate and regular rhythm.   Pulmonary/Chest: Effort normal and breath sounds normal.  Neurological: She is alert and oriented to person, place, and time.  Skin: Skin is warm and dry. Rash (Maculopaper, bilateral, extending from the axilla to the dorsal aspect of the hands, on the anterior and posterior forearms. Also noted underneath both eyes and behind both ears.) noted.  Psychiatric: She has a normal mood and affect. Her behavior is normal. Thought content normal.   BP 114/76 mmHg  Pulse 69  Temp(Src) 98.4 F (36.9 C) (Oral)   Resp 16  Ht 5\' 3"  (1.6 m)  Wt 165 lb 6.4 oz (75.025 kg)  BMI 29.31 kg/m2  SpO2 98%  LMP 12/09/2014     Assessment & Plan:     1. Dermatitis Rash likely needs longer course of Prednisone to stop reaction - 12 day taper prescribed. Counseled patient to continue avoiding hot baths. Patient to return if rash has not cleared after this 12 day course of Prednisone.  - predniSONE (DELTASONE) 10 MG tablet; 6-6-5-5-4-4-3-3-2-2-1-1 taper - Take pills for that day all together in the am with food.  Dispense: 42 tablet; Refill: 0   Roberta Valenzuela D. Race, PA-S Physician Assistant Student [facility]

## 2014-12-21 NOTE — Progress Notes (Signed)
   Roberta Valenzuela  MRN: [id number] DOB: [birth year]  Subjective:  Pt presents to clinic for evaluation of a rash. She was seen on 8/30 for this rash and given Prednisone and is now returning because the rash has not gone away. The rash completely cleared on Prednisone and returned to her arms once she stopped the medication.  Still no exposures that she knows of. The rash and swelling on her face and ears has improved, but the rash persists on her arms and occasionally on her legs. It is itchy, and worse when she gets hot and sweaty. It is worse when she comes into contact with water in the shower. No new soaps, lotions, detergents, or environmental exposures. No new medications. No one at home has a similar rash.    There are no active problems to display for this patient.   No current outpatient prescriptions on file prior to visit.   No current facility-administered medications on file prior to visit.    No Known Allergies  Review of Systems  Constitutional: Negative for fever and chills.  Skin: Positive for rash.   Objective:  BP 114/76 mmHg  Pulse 69  Temp(Src) 98.4 F (36.9 C) (Oral)  Resp 16  Ht 5\' 3"  (1.6 m)  Wt 165 lb 6.4 oz (75.025 kg)  BMI 29.31 kg/m2  SpO2 98%  LMP 12/09/2014  Physical Exam  Constitutional: She is oriented to person, place, and time and well-developed, well-nourished, and in no distress.  HENT:  Head: Normocephalic and atraumatic.  Right Ear: Hearing and external ear normal.  Left Ear: Hearing and external ear normal.  Eyes: Conjunctivae are normal.  Neck: Normal range of motion.  Pulmonary/Chest: Effort normal.  Neurological: She is alert and oriented to person, place, and time. Gait normal.  Skin: Skin is warm and dry.  Erythematous non-blanching maculopapular rash on arms mainly.  None on trunk. Scant amount on neck.  Psychiatric: Mood, memory, affect and judgment normal.  Vitals reviewed.   Assessment and Plan :  Dermatitis - Plan:  predniSONE (DELTASONE) 10 MG tablet   Due to the rash completely resolving with prednisone we will do another taper but this time for 2 weeks - and if the rash returns she will need to be referred to dermatology due to unknown cause of rash.  We could also do a biopsy but I really suspect this is a steroid responsive dermatitis.  Windell Hummingbird PA-C  [facility] 12/21/2014 5:53 PM

## 2015-05-07 IMAGING — US US OB COMP +14 WK
2 series · 12 of 28 positions shown · non-contrast
Comparison: none

[Series 1: us ob detail +14 wk · 10 of 55 slices shown (1 of 2)]
[im 3/55]
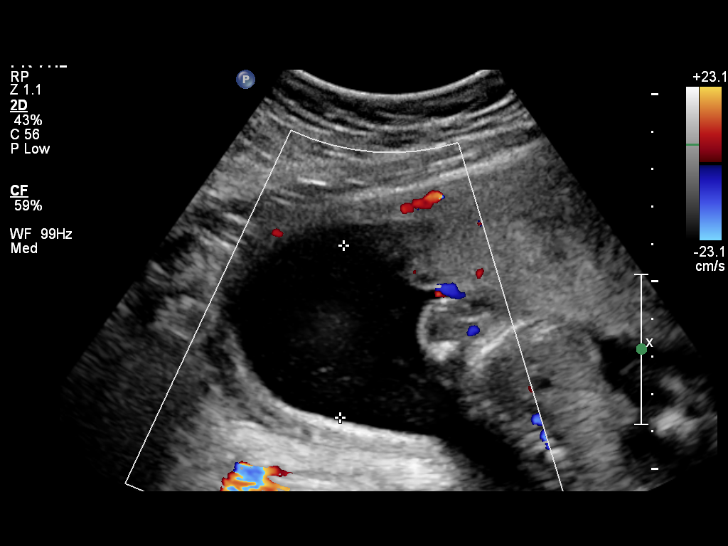
[im 8/55]
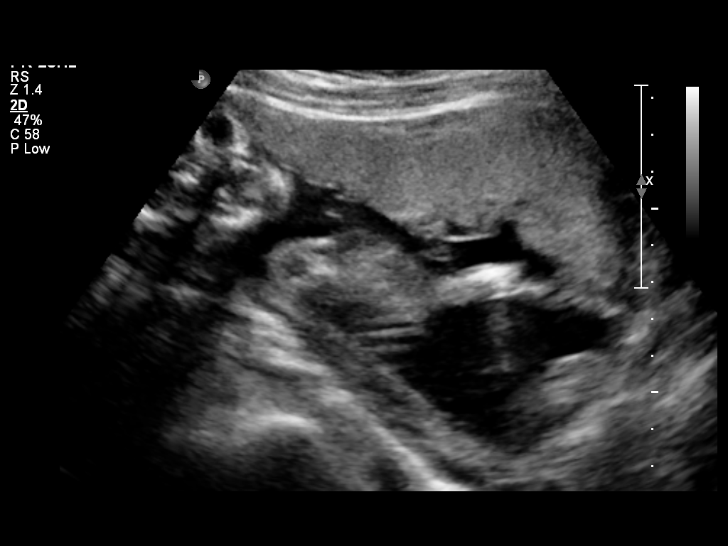
[im 13/55]
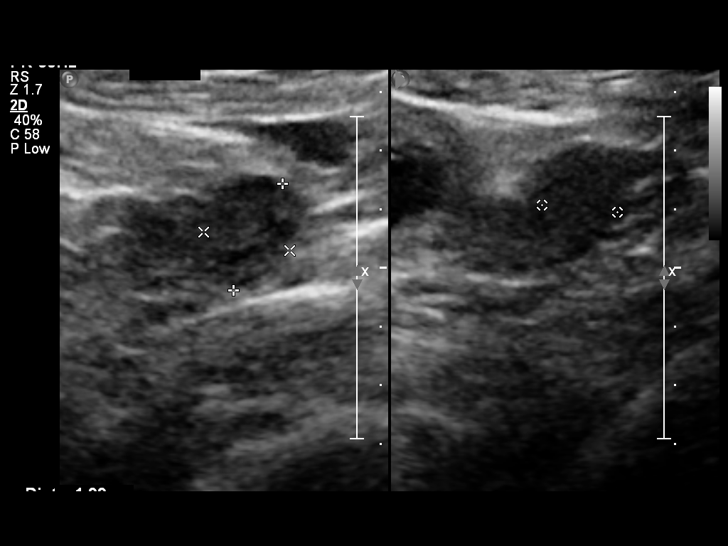
[im 20/55]
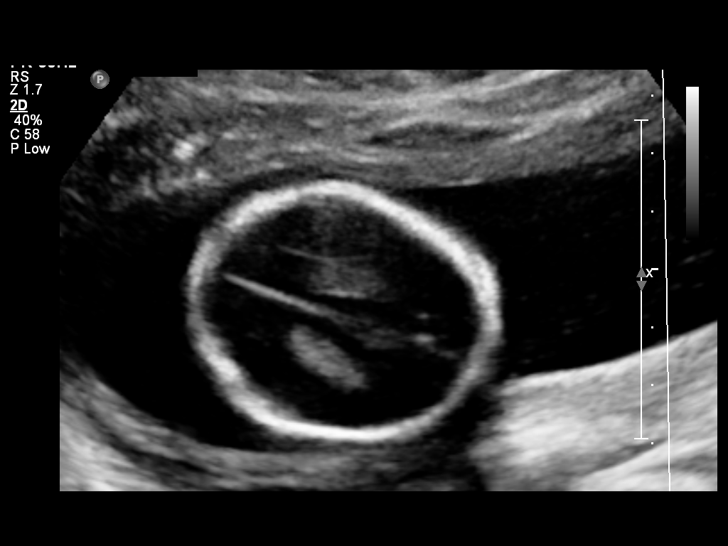
[im 25/55]
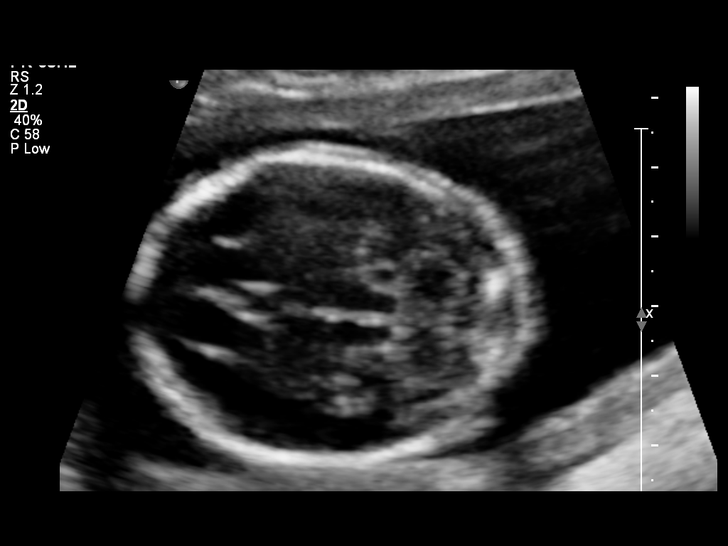
[im 30/55]
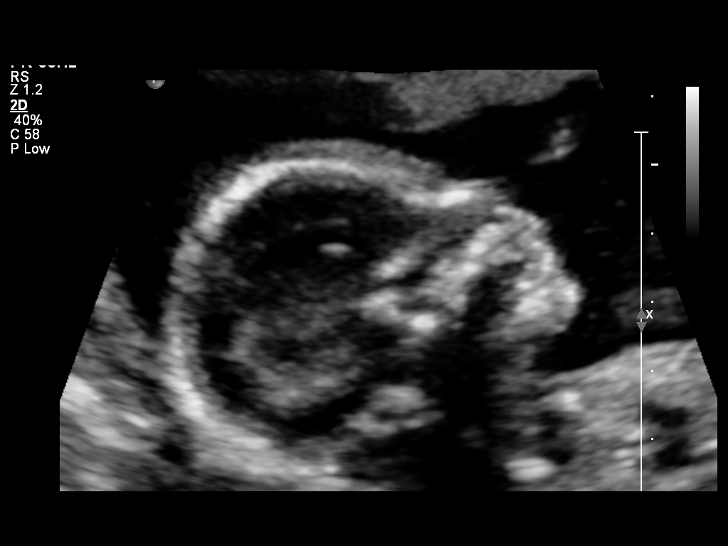
[im 37/55]
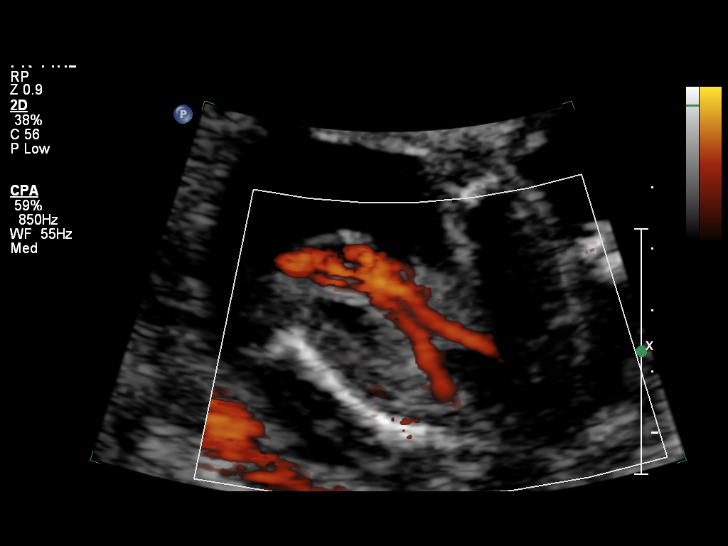
[im 42/55]
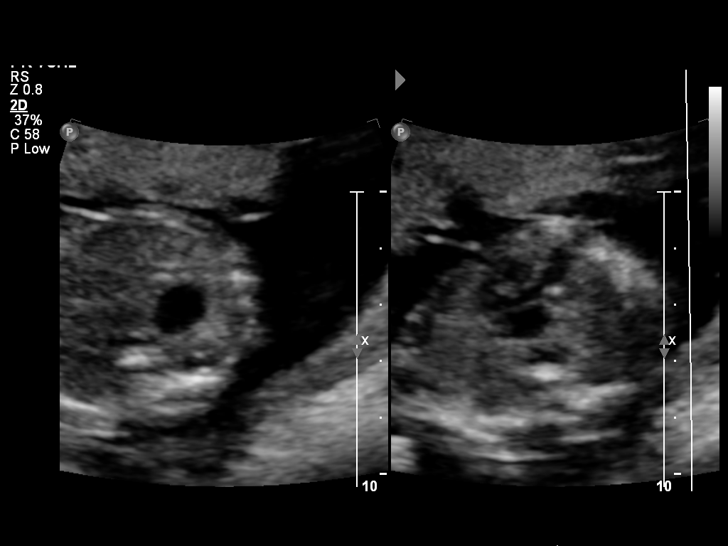
[im 47/55]
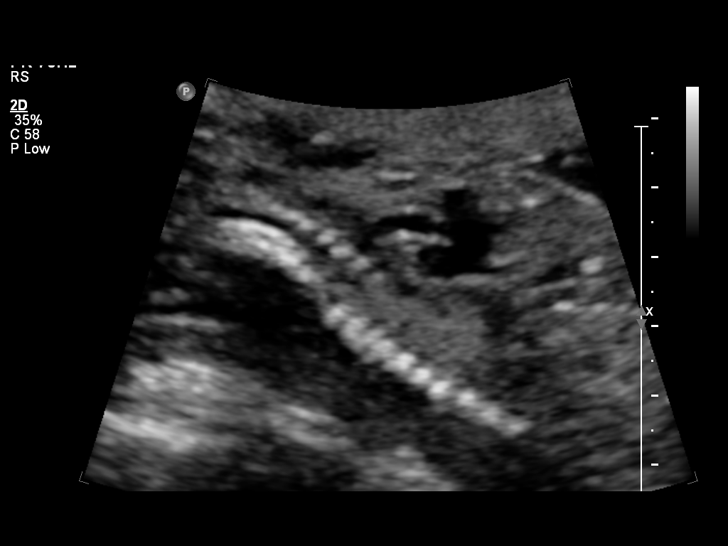
[im 55/55]
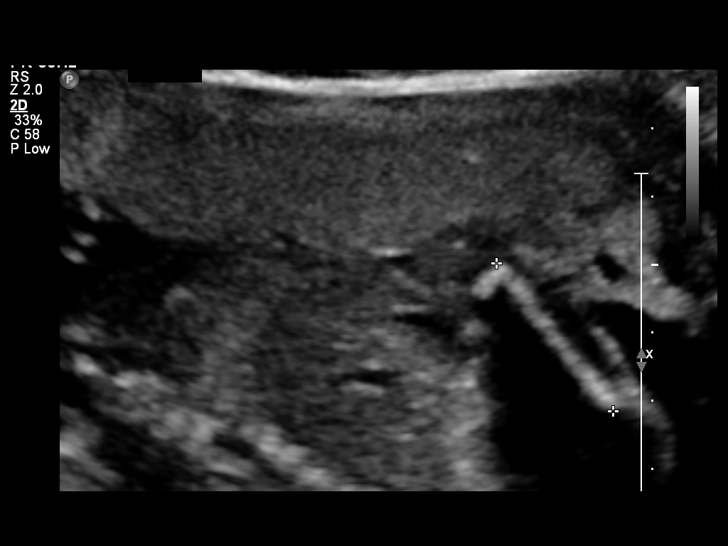

[Series 1: us ob detail +14 wk · 2 of 13 slices shown (2 of 2)]
[im 4/13]
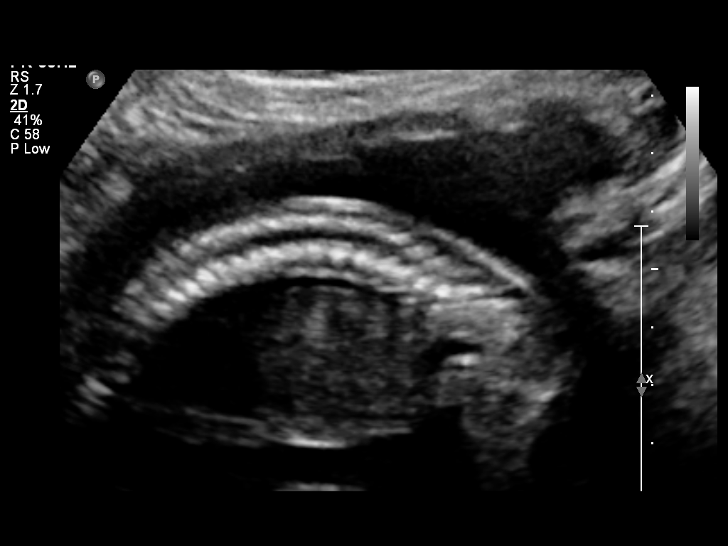
[im 10/13]
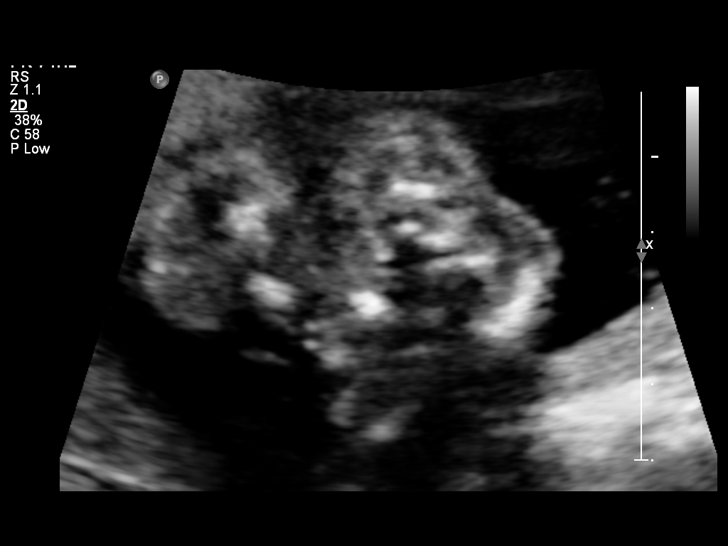

[12 of 28 positions shown; findings below may reference images not displayed]

OBSTETRICS REPORT
                      (Signed Final 01/26/2013 [DATE])

Service(s) Provided

 US OB COMP + 14 WK                                    76805.1
Indications

 Basic anatomic survey
Fetal Evaluation

 Num Of Fetuses:    1
 Fetal Heart Rate:  156                          bpm
 Cardiac Activity:  Observed
 Presentation:      Breech
 Placenta:          Anterior, above cervical os
 P. Cord            Visualized, central
 Insertion:

 Amniotic Fluid
 AFI FV:      Subjectively within normal limits
                                             Larg Pckt:    4.57  cm
Biometry

 BPD:     43.2  mm     G. Age:  19w 0d                CI:        73.18   70 - 86
                                                      FL/HC:      17.4   16.8 -

 HC:     160.5  mm     G. Age:  18w 6d       15  %    HC/AC:      1.19   1.09 -

 AC:     135.4  mm     G. Age:  19w 0d       27  %    FL/BPD:
 FL:        28  mm     G. Age:  18w 4d       13  %    FL/AC:      20.7   20 - 24
 CER:     19.8  mm     G. Age:  18w 6d       36  %
 NFT:     4.42  mm
 Est. FW:     259  gm      0 lb 9 oz     32  %
Gestational Age

 LMP:           19w 4d        Date:  09/11/12                 EDD:   06/18/13
 U/S Today:     18w 6d                                        EDD:   06/23/13
 Best:          19w 4d     Det. By:  LMP  (09/11/12)          EDD:   06/18/13
Anatomy

 Cranium:          Appears normal         Aortic Arch:      Not well visualized
 Fetal Cavum:      Appears normal         Ductal Arch:      Appears normal
 Ventricles:       Appears normal         Diaphragm:        Appears normal
 Choroid Plexus:   Appears normal         Stomach:          Appears normal, left
                                                            sided
 Cerebellum:       Appears normal         Abdomen:          Appears normal
 Posterior Fossa:  Appears normal         Abdominal Wall:   Appears nml (cord
                                                            insert, abd wall)
 Nuchal Fold:      Appears normal         Cord Vessels:     Appears normal (3
                                                            vessel cord)
 Face:             Profile appears        Kidneys:          Not well visualized
                   normal
 Lips:             Appears normal         Bladder:          Appears normal
 Heart:            Not well visualized    Spine:            Appears normal
 RVOT:             Not well visualized    Lower             Appears normal
                                          Extremities:
 LVOT:             Appears normal         Upper             Appears normal
                                          Extremities:

 Other:  Fetus appears to be a female.Heels visualized. Nasal bone
         visualized. Technically difficult due to fetal position.
Targeted Anatomy

 Fetal Central Nervous System
 Lat. Ventricles:  5.9                    Cisterna Magna:
Cervix Uterus Adnexa

 Cervical Length:    3.01     cm

 Cervix:       Normal appearance by transabdominal scan.

 Left Ovary:    Within normal limits.
 Right Ovary:   Within normal limits.
 Adnexa:     No abnormality visualized.
Impression

 IUP at 19+4 weeks
 Normal detailed fetal anatomy; heart and kidney views not
 optimally visualized
 Markers of aneuploidy: none
 Normal amniotic fluid volume
 Measurements consistent with LMP dating
Recommendations

 Follow-up ultrasound in 3-4 weeks to complete anatomy
 survey

 Thank you for sharing in the care of Ms. JHEMBOY PADAM with us.
 questions or concerns.

## 2015-11-07 LAB — OB RESULTS CONSOLE HIV ANTIBODY (ROUTINE TESTING): HIV: NONREACTIVE

## 2015-11-07 LAB — OB RESULTS CONSOLE RUBELLA ANTIBODY, IGM: Rubella: NON-IMMUNE/NOT IMMUNE

## 2015-11-07 LAB — OB RESULTS CONSOLE GC/CHLAMYDIA
Chlamydia: NEGATIVE
Gonorrhea: NEGATIVE

## 2015-11-07 LAB — OB RESULTS CONSOLE RPR: RPR: NONREACTIVE

## 2015-11-07 LAB — OB RESULTS CONSOLE ABO/RH: RH TYPE: POSITIVE

## 2015-11-07 LAB — OB RESULTS CONSOLE HEPATITIS B SURFACE ANTIGEN: Hepatitis B Surface Ag: NEGATIVE

## 2015-11-07 LAB — OB RESULTS CONSOLE ANTIBODY SCREEN: Antibody Screen: NEGATIVE

## 2015-12-04 ENCOUNTER — Encounter (HOSPITAL_COMMUNITY): Payer: Self-pay | Admitting: Nurse Practitioner

## 2015-12-09 ENCOUNTER — Other Ambulatory Visit (HOSPITAL_COMMUNITY): Payer: Self-pay | Admitting: Nurse Practitioner

## 2015-12-09 DIAGNOSIS — Z3A19 19 weeks gestation of pregnancy: Secondary | ICD-10-CM

## 2015-12-09 DIAGNOSIS — Z3689 Encounter for other specified antenatal screening: Secondary | ICD-10-CM

## 2015-12-11 ENCOUNTER — Ambulatory Visit (HOSPITAL_COMMUNITY)
Admission: RE | Admit: 2015-12-11 | Discharge: 2015-12-11 | Disposition: A | Discharge status: Home / Self Care | Payer: Medicaid Other | Source: Ambulatory Visit | Attending: Nurse Practitioner | Admitting: Nurse Practitioner

## 2015-12-11 ENCOUNTER — Encounter (HOSPITAL_COMMUNITY): Payer: Self-pay | Admitting: *Deleted

## 2015-12-11 ENCOUNTER — Other Ambulatory Visit (HOSPITAL_COMMUNITY): Payer: Self-pay | Admitting: Nurse Practitioner

## 2015-12-11 DIAGNOSIS — Z36 Encounter for antenatal screening of mother: Secondary | ICD-10-CM | POA: Insufficient documentation

## 2015-12-11 DIAGNOSIS — Z3689 Encounter for other specified antenatal screening: Secondary | ICD-10-CM

## 2015-12-11 DIAGNOSIS — Z3A19 19 weeks gestation of pregnancy: Secondary | ICD-10-CM | POA: Diagnosis not present

## 2015-12-11 DIAGNOSIS — O09522 Supervision of elderly multigravida, second trimester: Secondary | ICD-10-CM | POA: Diagnosis present

## 2015-12-12 ENCOUNTER — Other Ambulatory Visit (HOSPITAL_COMMUNITY): Payer: Self-pay

## 2016-04-02 LAB — OB RESULTS CONSOLE GC/CHLAMYDIA
Chlamydia: NEGATIVE
Gonorrhea: NEGATIVE

## 2016-04-02 LAB — OB RESULTS CONSOLE GBS: GBS: NEGATIVE

## 2016-04-13 NOTE — L&D Delivery Note (Signed)
OB Delivery Summary  36 y.o G2P1001 at [redacted]w[redacted]d delivered a viable female infant in cephalic, LOA position. There was no nuchal cord. The anterior shoulder delivered with ease. A 60 sec delayed cord clamping was performed. Cord clamped x2 and cut. Placenta delivered spontaneously intact, with 3VC. Fundus firm on exam with massage and pitocin. Good hemostasis noted.  Anesthesia: Epidural Laceration: 2nd degree labial laceration Suture: 3-0 Vicryl Good hemostasis noted. EBL: 100 cc  Mom and baby recovering in LDR.    Apgars: APGAR (1 MIN): 8   APGAR (5 MINS): 9   Weight: Pending skin to skin    Crissie Sickles, MD PGY-2 05/11/2016, 9:41 PM   [facility] [place]  I was gloved and present for the delivery in its entirety, and I agree with the above resident's note.    Jacquiline Doe, MD 10:24 PM

## 2016-05-05 ENCOUNTER — Telehealth (HOSPITAL_COMMUNITY): Payer: Self-pay | Admitting: *Deleted

## 2016-05-05 NOTE — Telephone Encounter (Signed)
Preadmission screen  

## 2016-05-11 ENCOUNTER — Inpatient Hospital Stay (HOSPITAL_COMMUNITY): Payer: Medicaid Other | Admitting: Anesthesiology

## 2016-05-11 ENCOUNTER — Encounter (HOSPITAL_COMMUNITY): Payer: Self-pay

## 2016-05-11 ENCOUNTER — Inpatient Hospital Stay (HOSPITAL_COMMUNITY)
Admission: RE | Admit: 2016-05-11 | Discharge: 2016-05-13 | DRG: 775 | Disposition: A | Discharge status: Home / Self Care | Payer: Medicaid Other | Source: Ambulatory Visit | Attending: Family Medicine | Admitting: Family Medicine

## 2016-05-11 DIAGNOSIS — D696 Thrombocytopenia, unspecified: Secondary | ICD-10-CM | POA: Diagnosis present

## 2016-05-11 DIAGNOSIS — O480 Post-term pregnancy: Secondary | ICD-10-CM | POA: Diagnosis present

## 2016-05-11 DIAGNOSIS — O9912 Other diseases of the blood and blood-forming organs and certain disorders involving the immune mechanism complicating childbirth: Secondary | ICD-10-CM | POA: Diagnosis present

## 2016-05-11 DIAGNOSIS — Z833 Family history of diabetes mellitus: Secondary | ICD-10-CM

## 2016-05-11 DIAGNOSIS — Z3A41 41 weeks gestation of pregnancy: Secondary | ICD-10-CM

## 2016-05-11 DIAGNOSIS — O99119 Other diseases of the blood and blood-forming organs and certain disorders involving the immune mechanism complicating pregnancy, unspecified trimester: Secondary | ICD-10-CM

## 2016-05-11 LAB — CBC
HEMATOCRIT: 35.8 % — AB (ref 36.0–46.0)
HEMOGLOBIN: 12.5 g/dL (ref 12.0–15.0)
MCH: 32.2 pg (ref 26.0–34.0)
MCHC: 34.9 g/dL (ref 30.0–36.0)
MCV: 92.3 fL (ref 78.0–100.0)
Platelets: 167 10*3/uL (ref 150–400)
RBC: 3.88 MIL/uL (ref 3.87–5.11)
RDW: 13.6 % (ref 11.5–15.5)
WBC: 7.3 10*3/uL (ref 4.0–10.5)

## 2016-05-11 LAB — TYPE AND SCREEN
ABO/RH(D): O POS
ANTIBODY SCREEN: NEGATIVE

## 2016-05-11 MED ORDER — PHENYLEPHRINE 40 MCG/ML (10ML) SYRINGE FOR IV PUSH (FOR BLOOD PRESSURE SUPPORT)
80.0000 ug | PREFILLED_SYRINGE | INTRAVENOUS | Status: DC | PRN
  Filled 2016-05-11: qty 5
  Filled 2016-05-11: qty 10

## 2016-05-11 MED ORDER — EPHEDRINE 5 MG/ML INJ
10.0000 mg | INTRAVENOUS | Status: DC | PRN
  Filled 2016-05-11: qty 4

## 2016-05-11 MED ORDER — OXYCODONE-ACETAMINOPHEN 5-325 MG PO TABS: 2.0000 | ORAL_TABLET | ORAL | Status: DC | PRN

## 2016-05-11 MED ORDER — OXYTOCIN BOLUS FROM INFUSION: 500.0000 mL | Freq: Once | INTRAVENOUS | Status: DC

## 2016-05-11 MED ORDER — LACTATED RINGERS IV SOLN
500.0000 mL | Freq: Once | INTRAVENOUS | Status: AC
  Administered 2016-05-11: 500 mL via INTRAVENOUS

## 2016-05-11 MED ORDER — MISOPROSTOL 50MCG HALF TABLET
50.0000 ug | ORAL_TABLET | ORAL | Status: DC
  Administered 2016-05-11: 50 ug via VAGINAL
  Filled 2016-05-11: qty 0.5

## 2016-05-11 MED ORDER — OXYTOCIN 40 UNITS IN LACTATED RINGERS INFUSION - SIMPLE MED: 1.0000 m[IU]/min | INTRAVENOUS | Status: DC

## 2016-05-11 MED ORDER — LIDOCAINE HCL (PF) 1 % IJ SOLN
30.0000 mL | INTRAMUSCULAR | Status: DC | PRN
  Filled 2016-05-11: qty 30

## 2016-05-11 MED ORDER — OXYTOCIN 40 UNITS IN LACTATED RINGERS INFUSION - SIMPLE MED
1.0000 m[IU]/min | INTRAVENOUS | Status: DC
Start: 2016-05-11 — End: 2016-05-12
  Administered 2016-05-11: 2 m[IU]/min via INTRAVENOUS
  Administered 2016-05-11: 666 m[IU]/min via INTRAVENOUS
  Filled 2016-05-11: qty 1000

## 2016-05-11 MED ORDER — OXYTOCIN 40 UNITS IN LACTATED RINGERS INFUSION - SIMPLE MED: 2.5000 [IU]/h | INTRAVENOUS | Status: DC

## 2016-05-11 MED ORDER — DIPHENHYDRAMINE HCL 50 MG/ML IJ SOLN: 12.5000 mg | INTRAMUSCULAR | Status: DC | PRN

## 2016-05-11 MED ORDER — TERBUTALINE SULFATE 1 MG/ML IJ SOLN
0.2500 mg | Freq: Once | INTRAMUSCULAR | Status: DC | PRN
  Filled 2016-05-11: qty 1

## 2016-05-11 MED ORDER — LACTATED RINGERS IV SOLN: 500.0000 mL | INTRAVENOUS | Status: DC | PRN

## 2016-05-11 MED ORDER — ACETAMINOPHEN 325 MG PO TABS: 650.0000 mg | ORAL_TABLET | ORAL | Status: DC | PRN

## 2016-05-11 MED ORDER — LACTATED RINGERS IV SOLN
INTRAVENOUS | Status: DC
  Administered 2016-05-11: 125 mL via INTRAVENOUS

## 2016-05-11 MED ORDER — FENTANYL 2.5 MCG/ML BUPIVACAINE 1/10 % EPIDURAL INFUSION (WH - ANES): 14.0000 mL/h | INTRAMUSCULAR | Status: DC | PRN

## 2016-05-11 MED ORDER — PHENYLEPHRINE 40 MCG/ML (10ML) SYRINGE FOR IV PUSH (FOR BLOOD PRESSURE SUPPORT)
80.0000 ug | PREFILLED_SYRINGE | INTRAVENOUS | Status: DC | PRN
  Filled 2016-05-11: qty 5

## 2016-05-11 MED ORDER — ONDANSETRON HCL 4 MG/2ML IJ SOLN: 4.0000 mg | Freq: Four times a day (QID) | INTRAMUSCULAR | Status: DC | PRN

## 2016-05-11 MED ORDER — FENTANYL 2.5 MCG/ML BUPIVACAINE 1/10 % EPIDURAL INFUSION (WH - ANES)
14.0000 mL/h | INTRAMUSCULAR | Status: DC | PRN
  Administered 2016-05-11: 14 mL/h via EPIDURAL
  Filled 2016-05-11: qty 100

## 2016-05-11 MED ORDER — SOD CITRATE-CITRIC ACID 500-334 MG/5ML PO SOLN: 30.0000 mL | ORAL | Status: DC | PRN

## 2016-05-11 MED ORDER — TERBUTALINE SULFATE 1 MG/ML IJ SOLN: 0.2500 mg | Freq: Once | INTRAMUSCULAR | Status: DC | PRN

## 2016-05-11 MED ORDER — OXYCODONE-ACETAMINOPHEN 5-325 MG PO TABS: 1.0000 | ORAL_TABLET | ORAL | Status: DC | PRN

## 2016-05-11 MED ORDER — LIDOCAINE HCL (PF) 1 % IJ SOLN
INTRAMUSCULAR | Status: DC | PRN
  Administered 2016-05-11 (×2): 4 mL

## 2016-05-11 MED ORDER — MISOPROSTOL 200 MCG PO TABS: 50.0000 ug | ORAL_TABLET | ORAL | Status: DC

## 2016-05-11 NOTE — Anesthesia Procedure Notes (Signed)
Epidural Patient location during procedure: OB Start time: 05/11/2016 5:20 PM End time: 05/11/2016 5:30 PM  Staffing Anesthesiologist: Nolon Nations Performed: anesthesiologist   Preanesthetic Checklist Completed: patient identified, pre-op evaluation, timeout performed, IV checked, risks and benefits discussed and monitors and equipment checked  Epidural Patient position: sitting Prep: site prepped and draped and DuraPrep Patient monitoring: heart rate Approach: midline Location: L3-L4 Injection technique: LOR air and LOR saline  Needle:  Needle type: Tuohy  Needle gauge: 17 G Needle length: 9 cm Needle insertion depth: 7 cm Catheter type: closed end flexible Catheter size: 19 Gauge Catheter at skin depth: 13 cm Test dose: negative  Assessment Sensory level: T8 Events: blood not aspirated, injection not painful, no injection resistance, negative IV test and no paresthesia  Additional Notes Reason for block:procedure for pain

## 2016-05-11 NOTE — Progress Notes (Addendum)
Patient ID: Roberta Valenzuela, female   DOB: 31-Jul-1980, 36 y.o.   MRN: [id number] Doing well.  FHR stable UCs irregular  Cervix checked to prep for foley insertion but found to be  4cm/80/-2/vertex  Will have her get epidural then consider Pitocin

## 2016-05-11 NOTE — Anesthesia Preprocedure Evaluation (Addendum)
Anesthesia Evaluation  Patient identified by MRN, date of birth, ID band Patient awake    Reviewed: Allergy & Precautions, H&P , NPO status , Patient's Chart, lab work & pertinent test results  Airway Mallampati: II  TM Distance: >3 FB     Dental   Pulmonary neg pulmonary ROS,    Pulmonary exam normal        Cardiovascular negative cardio ROS   Rhythm:Regular     Neuro/Psych negative neurological ROS  negative psych ROS   GI/Hepatic negative GI ROS, Neg liver ROS,   Endo/Other  negative endocrine ROS  Renal/GU negative Renal ROS     Musculoskeletal negative musculoskeletal ROS (+)   Abdominal   Peds  Hematology negative hematology ROS (+)   Anesthesia Other Findings   Reproductive/Obstetrics (+) Pregnancy                            Anesthesia Physical  Anesthesia Plan  ASA: II  Anesthesia Plan: Epidural   Post-op Pain Management:    Induction:   Airway Management Planned:   Additional Equipment:   Intra-op Plan:   Post-operative Plan:   Informed Consent: I have reviewed the patients History and Physical, chart, labs and discussed the procedure including the risks, benefits and alternatives for the proposed anesthesia with the patient or authorized representative who has indicated his/her understanding and acceptance.     Plan Discussed with:   Anesthesia Plan Comments:         Anesthesia Quick Evaluation

## 2016-05-11 NOTE — Anesthesia Pain Management Evaluation Note (Signed)
  CRNA Pain Management Visit Note  Patient: Roberta Valenzuela, 36 y.o., female  "Hello I am a member of the anesthesia team at [facility]. We have an anesthesia team available at all times to provide care throughout the hospital, including epidural management and anesthesia for C-section. I don't know your plan for the delivery whether it a natural birth, water birth, IV sedation, nitrous supplementation, doula or epidural, but we want to meet your pain goals."   1.Was your pain managed to your expectations on prior hospitalizations?   Yes   2.What is your expectation for pain management during this hospitalization?     Epidural  3.How can we help you reach that goal? Epidural when the cervix is dilated sufficiently.  Record the patient's initial score and the patient's pain goal.   Pain: 3  Pain Goal: 7 The [facility] wants you to be able to say your pain was always managed very well.  Carmelita Amparo 05/11/2016

## 2016-05-11 NOTE — H&P (Signed)
LABOR AND DELIVERY ADMISSION HISTORY AND PHYSICAL NOTE  Roberta Valenzuela is a 36 y.o. female G2P1001 with IUP at [redacted]w[redacted]d by LMP presenting for induction of labor for postdates. Denies any HA, changes in vision, CP, SOB, NV or diarrhea. She reports positive fetal movement. She denies leakage of fluid or vaginal bleeding.  Prenatal History/Complications:  Past Medical History: Past Medical History:  Diagnosis Date  . Lipoma   . Medical history non-contributory     Past Surgical History: Past Surgical History:  Procedure Laterality Date  . NO PAST SURGERIES      Obstetrical History: OB History    Gravida Para Term Preterm AB Living   2 1 1     1    SAB TAB Ectopic Multiple Live Births           1      Social History: Social History   Social History  . Marital status: Married    Spouse name: N/A  . Number of children: N/A  . Years of education: N/A   Social History Main Topics  . Smoking status: Never Smoker  . Smokeless tobacco: Never Used  . Alcohol use No  . Drug use: No  . Sexual activity: Yes   Other Topics Concern  . None   Social History Narrative  . None    Family History: Family History  Problem Relation Age of Onset  . Diabetes Mother     Allergies: No Known Allergies  Prescriptions Prior to Admission  Medication Sig Dispense Refill Last Dose  . Prenatal Vit-Fe Fumarate-FA (PRENATAL VITAMIN PO) Take by mouth.   Taking     Review of Systems   All systems reviewed and negative except as stated in HPI  Blood pressure 107/68, pulse (!) 101, temperature 97.7 F (36.5 C), temperature source Oral, resp. rate 20, height 5\' 4"  (1.626 m), weight 80.3 kg (177 lb), last menstrual period 07/28/2015, unknown if currently breastfeeding. General appearance: alert and cooperative Lungs: clear to auscultation bilaterally Heart: regular rate and rhythm Abdomen: soft, non-tender; bowel sounds normal Extremities: No calf swelling or tenderness Presentation:  cephalic Fetal monitoring:  FHR 140s, mod variability, pos accels, no decels, occasional contractions Uterine activity: 1cm, thick and posterior     Prenatal labs: ABO, Rh: O/Positive/-- (07/27 0000) Antibody: Negative (07/27 0000) Rubella: non-immune RPR: Nonreactive (07/27 0000)  HBsAg: Negative (07/27 0000)  HIV: Non-reactive (07/27 0000)  GBS: Negative (12/21 0000)  1 hr Glucola: 129 Genetic screening:  neg Anatomy US: normal  Prenatal Transfer Tool  Maternal Diabetes: No Genetic Screening: Normal Maternal Ultrasounds/Referrals: Normal Fetal Ultrasounds or other Referrals:  None Maternal Substance Abuse:  No Significant Maternal Medications:  None Significant Maternal Lab Results: None  No results found for this or any previous visit (from the past 24 hour(s)).  Patient Active Problem List   Diagnosis Date Noted  . Thrombocytopenia affecting pregnancy ([place]) 05/11/2016  . Post term pregnancy 05/11/2016    Assessment: Roberta Valenzuela is a 36 y.o. G2P1001 at [redacted]w[redacted]d here for IOL for postdates.  She has had an uncomplicated pregnancy.  She does have thrombocytopenia to 167 and historically has been around 200.  This may complicate epidural placement.   #Labor: IOL with foley bulb, anticipate SVD #Pain: epidural if possible, otherwise IV pain medication #FWB:  Category I #ID: GBS neg #MOF: Breast #MOC: undecided #Circ:  Circ out  Eloise Levels, MD PGY-1 05/11/2016, 12:21 PM  The patient was seen and examined by me also Agree  with note Fetal heart rate reassuring UCs irregular Dilation: 1 Effacement (%): 50 Cervical Position: Posterior Station: -2 Presentation: Vertex Exam by:: h koran rnc  NST reactive and reassuring UCs as listed Cervical exams as listed in note  Seabron Spates, CNM

## 2016-05-11 NOTE — Progress Notes (Signed)
Patient ID: Roberta Valenzuela, female   DOB: July 06, 1980, 36 y.o.   MRN: [id number] UC s spaced out FHR stable  Comfortable with epidural  Will start Pitocin

## 2016-05-11 NOTE — Progress Notes (Signed)
Roberta Valenzuela is a 36 y.o. G2P1001 at [redacted]w[redacted]d by LMP admitted for induction of labor due to Post dates. Due date .  Subjective: Doing well, hungry  Objective: BP (!) 97/51   Pulse 70   Temp 98.2 F (36.8 C) (Oral)   Resp 20   Ht 5\' 4"  (1.626 m)   Wt 177 lb (80.3 kg)   LMP 07/28/2015   BMI 30.38 kg/m  No intake/output data recorded. No intake/output data recorded.  FHT:  FHR: 130 bpm, variability: moderate,  accelerations:  Present,  decelerations:  Absent UC:   irregular SVE:   Dilation: 1 Effacement (%): 50 Station: -2 Exam by:: h koran rnc  Labs: Lab Results  Component Value Date   WBC 7.3 05/11/2016   HGB 12.5 05/11/2016   HCT 35.8 (L) 05/11/2016   MCV 92.3 05/11/2016   PLT 167 05/11/2016    Assessment / Plan: Induction of labor due to postterm,  progressing well on pitocin  Labor: Progressing normally Preeclampsia:  n/a Fetal Wellbeing:  Category I Pain Control:  Labor support without medications I/D:  n/a Anticipated MOD:  NSVD  Hansel Feinstein 05/11/2016, 3:55 PM

## 2016-05-12 ENCOUNTER — Encounter (HOSPITAL_COMMUNITY): Payer: Self-pay

## 2016-05-12 DIAGNOSIS — Z3A41 41 weeks gestation of pregnancy: Secondary | ICD-10-CM

## 2016-05-12 DIAGNOSIS — O480 Post-term pregnancy: Secondary | ICD-10-CM

## 2016-05-12 LAB — RPR: RPR: NONREACTIVE

## 2016-05-12 LAB — HIV ANTIBODY (ROUTINE TESTING W REFLEX): HIV Screen 4th Generation wRfx: NONREACTIVE

## 2016-05-12 MED ORDER — IBUPROFEN 600 MG PO TABS
600.0000 mg | ORAL_TABLET | Freq: Four times a day (QID) | ORAL | Status: DC
  Administered 2016-05-12 – 2016-05-13 (×7): 600 mg via ORAL
  Filled 2016-05-12 (×7): qty 1

## 2016-05-12 MED ORDER — WITCH HAZEL-GLYCERIN EX PADS: 1.0000 "application " | MEDICATED_PAD | CUTANEOUS | Status: DC | PRN

## 2016-05-12 MED ORDER — COCONUT OIL OIL: 1.0000 "application " | TOPICAL_OIL | Status: DC | PRN

## 2016-05-12 MED ORDER — BENZOCAINE-MENTHOL 20-0.5 % EX AERO
1.0000 "application " | INHALATION_SPRAY | CUTANEOUS | Status: DC | PRN
  Administered 2016-05-12: 1 via TOPICAL
  Filled 2016-05-12: qty 56

## 2016-05-12 MED ORDER — ONDANSETRON HCL 4 MG PO TABS: 4.0000 mg | ORAL_TABLET | ORAL | Status: DC | PRN

## 2016-05-12 MED ORDER — TETANUS-DIPHTH-ACELL PERTUSSIS 5-2.5-18.5 LF-MCG/0.5 IM SUSP: 0.5000 mL | Freq: Once | INTRAMUSCULAR | Status: DC

## 2016-05-12 MED ORDER — DIPHENHYDRAMINE HCL 25 MG PO CAPS: 25.0000 mg | ORAL_CAPSULE | Freq: Four times a day (QID) | ORAL | Status: DC | PRN

## 2016-05-12 MED ORDER — ZOLPIDEM TARTRATE 5 MG PO TABS: 5.0000 mg | ORAL_TABLET | Freq: Every evening | ORAL | Status: DC | PRN

## 2016-05-12 MED ORDER — ACETAMINOPHEN 325 MG PO TABS
650.0000 mg | ORAL_TABLET | ORAL | Status: DC | PRN
  Administered 2016-05-12 – 2016-05-13 (×2): 650 mg via ORAL
  Filled 2016-05-12 (×2): qty 2

## 2016-05-12 MED ORDER — ONDANSETRON HCL 4 MG/2ML IJ SOLN: 4.0000 mg | INTRAMUSCULAR | Status: DC | PRN

## 2016-05-12 MED ORDER — SIMETHICONE 80 MG PO CHEW: 80.0000 mg | CHEWABLE_TABLET | ORAL | Status: DC | PRN

## 2016-05-12 MED ORDER — OSELTAMIVIR PHOSPHATE 75 MG PO CAPS
75.0000 mg | ORAL_CAPSULE | Freq: Two times a day (BID) | ORAL | Status: DC
  Administered 2016-05-12 – 2016-05-13 (×4): 75 mg via ORAL
  Filled 2016-05-12 (×6): qty 1

## 2016-05-12 MED ORDER — DIBUCAINE 1 % RE OINT: 1.0000 "application " | TOPICAL_OINTMENT | RECTAL | Status: DC | PRN

## 2016-05-12 MED ORDER — PRENATAL MULTIVITAMIN CH
1.0000 | ORAL_TABLET | Freq: Every day | ORAL | Status: DC
  Administered 2016-05-12 – 2016-05-13 (×2): 1 via ORAL
  Filled 2016-05-12 (×2): qty 1

## 2016-05-12 MED ORDER — SENNOSIDES-DOCUSATE SODIUM 8.6-50 MG PO TABS
2.0000 | ORAL_TABLET | ORAL | Status: DC
  Administered 2016-05-12 – 2016-05-13 (×2): 2 via ORAL
  Filled 2016-05-12 (×2): qty 2

## 2016-05-12 NOTE — Progress Notes (Signed)
POSTPARTUM PROGRESS NOTE  Post Partum Day #1  Subjective:  Roberta Valenzuela is a 36 y.o. [id number] [redacted]w[redacted]d s/p SVD after IOL secondary to post dates.  No acute events overnight.  Pt denies problems with ambulating, voiding or po intake.  She denies nausea or vomiting.  Pain is well controlled.  She has had flatus. She has not had bowel movement.  Lochia Small.   Objective: Blood pressure 112/70, pulse 64, temperature 97.9 F (36.6 C), temperature source Oral, resp. rate 18, height 5\' 4"  (1.626 m), weight 177 lb (80.3 kg), last menstrual period 07/28/2015, unknown if currently breastfeeding.  Physical Exam:  General: alert, cooperative and no distress Lochia:normal flow Chest: CTAB Heart: RRR no m/r/g Abdomen: +BS, soft, nontender,  Uterine Fundus: firm, palpable just inferior to the umbilicus DVT Evaluation: No calf swelling or tenderness Extremities: no lower extremity edema   Recent Labs  05/11/16 1145  HGB 12.5  HCT 35.8*    Assessment/Plan:  ASSESSMENT: Roberta Valenzuela is a 36 y.o. [id number] [redacted]w[redacted]d s/p SVD after IOL secondary to post dates.   Patient is doing well. She will have reached 24hrs post partum much later this evening and as a result will stay until tomorrow to allow for monitoring and support as well as pain control.   Plan for discharge tomorrow   LOS: 1 day   Crissie Sickles, MD PGY-2 05/12/2016, 9:06 AM   OB FELLOW POSTPARTUM PROGRESS NOTE ATTESTATION  I have seen and examined this patient and agree with above documentation in the resident's note.   Jacquiline Doe, MD 9:02 AM

## 2016-05-12 NOTE — Anesthesia Postprocedure Evaluation (Signed)
Anesthesia Post Note  Patient: Roberta Valenzuela  Procedure(s) Performed: * No procedures listed *  Patient location during evaluation: Mother Baby Anesthesia Type: Epidural Level of consciousness: awake and awake and alert Pain management: pain level controlled Vital Signs Assessment: post-procedure vital signs reviewed and stable Respiratory status: spontaneous breathing Cardiovascular status: stable Postop Assessment: no headache, no backache, epidural receding and patient able to bend at knees Anesthetic complications: no        Last Vitals:  Vitals:   05/12/16 0036 05/12/16 0436  BP: 106/62 112/70  Pulse: 66 64  Resp: 18 18  Temp: 36.9 C 36.6 C    Last Pain:  Vitals:   05/12/16 0725  TempSrc:   PainSc: 0-No pain   Pain Goal:                 Everette Rank

## 2016-05-12 NOTE — Lactation Note (Signed)
This note was copied from a baby's chart. Lactation Consultation Note  Patient Name: Boy Deirdra Mikles [id number] Date: 05/12/2016 Reason for consult: Initial assessment Mother was sleeping and woke when I entered room.  This is her second baby and she breastfed her first baby without problems.  Newborn is 74 hours old and going to breast with easy latch per mom.  Instructed to watch for feeding cues and to offer breast with any cue.  Encouraged to call for assist/concerns prn.  Maternal Data Does the patient have breastfeeding experience prior to this delivery?: Yes  Feeding Feeding Type: Breast Fed Length of feed: 20 min  LATCH Score/Interventions Latch: Grasps breast easily, tongue down, lips flanged, rhythmical sucking.  Audible Swallowing: Spontaneous and intermittent  Type of Nipple: Everted at rest and after stimulation  Comfort (Breast/Nipple): Soft / non-tender     Hold (Positioning): No assistance needed to correctly position infant at breast.  LATCH Score: 10  Lactation Tools Discussed/Used     Consult Status Consult Status: Follow-up Date: 05/13/16    Ave Filter 05/12/2016, 3:22 PM

## 2016-05-13 ENCOUNTER — Other Ambulatory Visit: Payer: Self-pay | Admitting: Family Medicine

## 2016-05-13 ENCOUNTER — Encounter (HOSPITAL_COMMUNITY): Payer: Self-pay

## 2016-05-13 DIAGNOSIS — Z3A41 41 weeks gestation of pregnancy: Secondary | ICD-10-CM

## 2016-05-13 DIAGNOSIS — O480 Post-term pregnancy: Secondary | ICD-10-CM

## 2016-05-13 NOTE — Discharge Instructions (Signed)

## 2016-05-13 NOTE — Lactation Note (Addendum)
This note was copied from a baby's chart. Lactation Consultation Note  Patient Name: Roberta Valenzuela [id number] Date: 05/13/2016 Reason for consult: Follow-up assessment   Follow up with mom of 27 hour old infant. Infant with 5 BF for 15-30 minutes, 1 attempt, 4 bottle feeds of formula of 10-12 cc, 3 voids and 0 stool in 24 hours preceding this assessment. Infant weight 7 lb 9.2 oz with weight loss of 7% since birth. Latch scores 8-10.   Infant currently asleep in crib, parents report he was up a lot last night. Mom reports infant last fed formula by bottle at 0630 and he was asleep. Enc parents to awaken him to feed since almost 4 hours. Awakened infant and removed heavy sleeper to put him to breast. Diaper clean and dry. Infant needed a lot of awakening techniques to latch and stay awake at breast. Mother and father did well with using awakening techniques once shown, infant was feeding more actively as the feeding progressed. Discussed with parents that due to weight loss and no stool in over 24 hours to encourage infant to feed STS 8-12 x in 24 hours. Recommended DEBP and feeding tube at breast, mom declined both saying she would rather use the manual pump.   Enc mom to offer breast at each feeding and discussed supply and demand and milk coming to volume. Enc mom to offer supplement of EBM/formula post BF and discussed formula supplement amounts based on day of age. Parents were informed to increase supplement amount once infant 48 hours old, parents voiced understanding. Parents confirmed infant has not stooled since yesterday. Infant was noted to be passing gas several times while I was in the room.   Enc mom to pump with manual pump post BF and to offer infant EBM prior to formula. Small amount of colostrum was noted to left breast with hand expression, was not able to express from right breast. Infant fed on right breast and was noted to have a few swallows. Enc mom to massage/compress breast with  feeding. Discussed engorgement prevention/treatment with mom. Discussed I/O and what to expect with stooling patterns. Discussed Breast milk handling and storage with parents.   Offered mom [facility] loaner and mom declined. Dad reports he spoke with [facility] yesterday and is to call back and make an appointment. Infant with follow up Ped appt tomorrow afternoon per parents. [facility] Brochure reviewed, mom informed of OP services, BF Support Groups and [place] phone #. Enc parents to call out to desk for BF assistance as needed and to call once home for assistance as needed. Parents without further questions/concerns.   Report to Charlyne Petrin, RN.     Maternal Data Formula Feeding for Exclusion: No Has patient been taught Hand Expression?: Yes Does the patient have breastfeeding experience prior to this delivery?: Yes  Feeding Feeding Type: Breast Fed Length of feed: 10 min  LATCH Score/Interventions Latch: Repeated attempts needed to sustain latch, nipple held in mouth throughout feeding, stimulation needed to elicit sucking reflex. Intervention(s): Breast compression;Breast massage;Assist with latch;Adjust position  Audible Swallowing: A few with stimulation Intervention(s): Skin to skin;Hand expression;Alternate breast massage  Type of Nipple: Everted at rest and after stimulation  Comfort (Breast/Nipple): Filling, red/small blisters or bruises, mild/mod discomfort  Problem noted: Mild/Moderate discomfort Interventions (Mild/moderate discomfort): Hand expression;Post-pump (EBM to nipples post BF)  Hold (Positioning): Assistance needed to correctly position infant at breast and maintain latch. Intervention(s): Breastfeeding basics reviewed;Support Pillows;Position options;Skin to skin  LATCH Score: 6  Lactation Tools Discussed/Used WIC Program: Yes Pump Review: Setup, frequency, and cleaning;Milk Storage Initiated by:: Reviewed and encouraged post BF   Consult Status Consult Status:  Complete Date: 05/13/16 Follow-up type: Call as needed    Donn Pierini 05/13/2016, 10:47 AM

## 2016-05-13 NOTE — Discharge Summary (Signed)
OB Discharge Summary     Patient Name: Roberta Valenzuela DOB: 11/16/1980 MRN: [id number]  Date of admission: 05/11/2016 Delivering MD: Jacquiline Doe MICHAEL   Date of discharge: 05/13/2016  Admitting diagnosis: INDUCTION Intrauterine pregnancy: [redacted]w[redacted]d     Secondary diagnosis:  Active Problems:   Post term pregnancy  Additional problems: None     Discharge diagnosis: Term Pregnancy Delivered                                                                                                Post partum procedures:None  Augmentation: Pitocin and Cytotec  Complications: None  Hospital course:  Induction of Labor With Vaginal Delivery   36 y.o. yo [id number] at [redacted]w[redacted]d was admitted to the hospital 05/11/2016 for induction of labor.  Indication for induction: Postdates.  Patient had an uncomplicated labor course as follows: Membrane Rupture Time/Date: 8:40 PM ,05/11/2016   Intrapartum Procedures: Episiotomy: None [1]                                         Lacerations:  2nd degree [3];Periurethral [8];Labial [10]  Patient had delivery of a Viable infant.  Information for the patient's newborn:  Alianna, [place] [id number]  Delivery Method: Vag-Spont   05/11/2016  Details of delivery can be found in separate delivery note.  Patient had a routine postpartum course. Patient is discharged home 05/13/16.  Physical exam  Vitals:   05/12/16 0436 05/12/16 1045 05/12/16 1800 05/13/16 0651  BP: 112/70 (!) 106/59 102/69 (!) 97/59  Pulse: 64 69 67 87  Resp: 18 18 18 18   Temp: 97.9 F (36.6 C) 97.9 F (36.6 C) 97.9 F (36.6 C) 98.3 F (36.8 C)  TempSrc: Oral Axillary Oral Oral  Weight:      Height:       General: alert, cooperative and no distress Lochia: appropriate Uterine Fundus: firm Incision: N/A DVT Evaluation: No evidence of DVT seen on physical exam. No significant calf/ankle edema. Labs: Lab Results  Component Value Date   WBC 7.3 05/11/2016   HGB 12.5 05/11/2016   HCT 35.8 (L)  05/11/2016   MCV 92.3 05/11/2016   PLT 167 05/11/2016   No flowsheet data found.  Discharge instruction: per After Visit Summary and "Baby and Me Booklet".  After visit meds:  Allergies as of 05/13/2016   No Known Allergies     Medication List    TAKE these medications   acetaminophen 325 MG tablet Commonly known as:  TYLENOL Take 650 mg by mouth every 6 (six) hours as needed for mild pain or headache.   PRENATAL VITAMIN PO Take 1 tablet by mouth daily.   TAMIFLU 75 MG capsule Generic drug:  oseltamivir Take 75 mg by mouth 2 (two) times daily.       Diet: routine diet  Activity: Advance as tolerated. Pelvic rest for 6 weeks.   Outpatient follow up:6 weeks Follow up Appt:No future appointments. Follow up Visit:No Follow-up on file.  Postpartum contraception: Condoms  Newborn Data: Live born female  Birth Weight: 8 lb 2 oz (3685 g) APGAR: 8, 9  Baby Feeding: Bottle and breast Disposition:home with mother   05/13/2016 Crissie Sickles, MD Patient was seen and examined by me also Agree with note Vitals stable Labs stable Fundus firm, lochia within normal limits Perineum healing Ext WNL  Ready for discharge  Seabron Spates, CNM

## 2016-05-13 NOTE — Discharge Summary (Signed)
OB Discharge Summary     Patient Name: Roberta Valenzuela DOB: September 23, 1980 MRN: [id number]  Date of admission: 05/11/2016 Delivering MD: Jacquiline Doe MICHAEL   Date of discharge: 05/13/2016  Admitting diagnosis: INDUCTION Intrauterine pregnancy: [redacted]w[redacted]d     Secondary diagnosis:  Active Problems:   Post term pregnancy  Additional problems: none     Discharge diagnosis: Term Pregnancy Delivered                                                                        Post Term Pregnancy                Post partum procedures:none  Augmentation: Pitocin and Foley Balloon  Complications: None  Hospital course:  Induction of Labor With Vaginal Delivery   36 y.o. yo [id number] at [redacted]w[redacted]d was admitted to the hospital 05/11/2016 for induction of labor.  Indication for induction: Postdates.  Patient had an uncomplicated labor course as follows: Membrane Rupture Time/Date: 8:40 PM ,05/11/2016   Intrapartum Procedures: Episiotomy: None [1]                                         Lacerations:  2nd degree [3];Periurethral [8];Labial [10]  Patient had delivery of a Viable infant.  Information for the patient's newborn:  Rien, [place] [id number]  Delivery Method: Vag-Spont   05/11/2016  Details of delivery can be found in separate delivery note.  Patient had a routine postpartum course. Patient is discharged home 05/13/16.  Physical exam  Vitals:   05/12/16 0436 05/12/16 1045 05/12/16 1800 05/13/16 0651  BP: 112/70 (!) 106/59 102/69 (!) 97/59  Pulse: 64 69 67 87  Resp: 18 18 18 18   Temp: 97.9 F (36.6 C) 97.9 F (36.6 C) 97.9 F (36.6 C) 98.3 F (36.8 C)  TempSrc: Oral Axillary Oral Oral  Weight:      Height:       General: alert, cooperative and no distress Lochia: appropriate Uterine Fundus: firm Incision: Healing well with no significant drainage DVT Evaluation: No evidence of DVT seen on physical exam. Labs: Lab Results  Component Value Date   WBC 7.3 05/11/2016   HGB 12.5  05/11/2016   HCT 35.8 (L) 05/11/2016   MCV 92.3 05/11/2016   PLT 167 05/11/2016   No flowsheet data found.  Discharge instruction: per After Visit Summary and "Baby and Me Booklet".  After visit meds:  Allergies as of 05/13/2016   No Known Allergies     Medication List    TAKE these medications   acetaminophen 325 MG tablet Commonly known as:  TYLENOL Take 650 mg by mouth every 6 (six) hours as needed for mild pain or headache.   PRENATAL VITAMIN PO Take 1 tablet by mouth daily.   TAMIFLU 75 MG capsule Generic drug:  oseltamivir Take 75 mg by mouth 2 (two) times daily.       Diet: routine diet  Activity: Advance as tolerated. Pelvic rest for 6 weeks.   Outpatient follow up:6 weeks Follow up Appt:No future appointments. Follow up Visit:No Follow-up on file.  Postpartum contraception: Undecided  Newborn Data: Live born  female  Birth Weight: 8 lb 2 oz (3685 g) APGAR: 8, 9  Baby Feeding: Breast Disposition:home with mother   05/13/2016 Hansel Feinstein, CNM

## 2018-03-21 IMAGING — US US MFM OB DETAIL+14 WK
1 series · 14 of 28 positions shown · non-contrast
Comparison: none

[Series 1: us mfm ob detail+14 wk · 66 acquisitions, 14 frames shown]
[im 3/66]
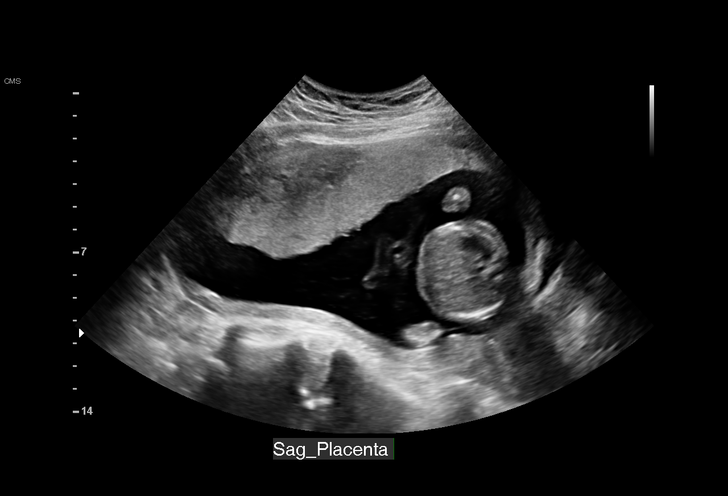
[im 8/66]
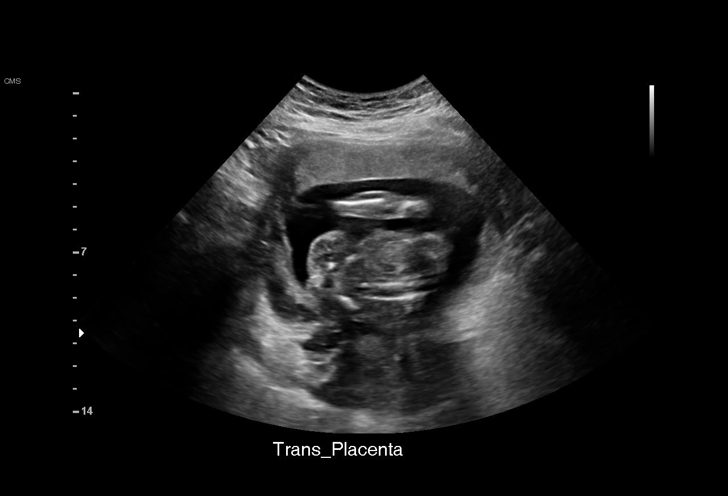
[im 13/66]
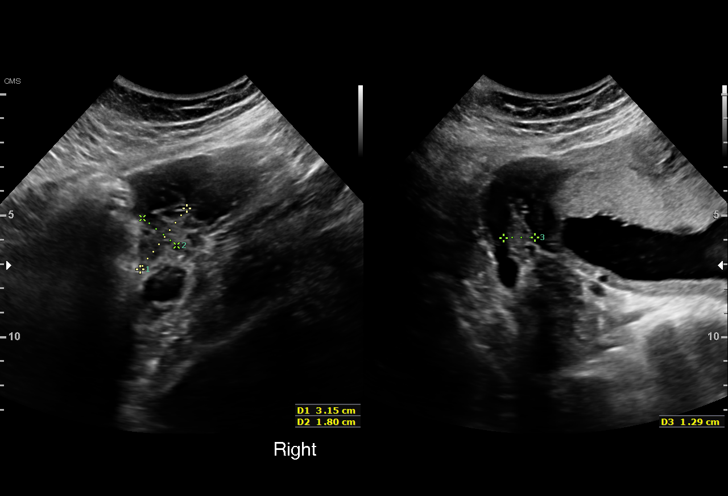
[im 17/66]
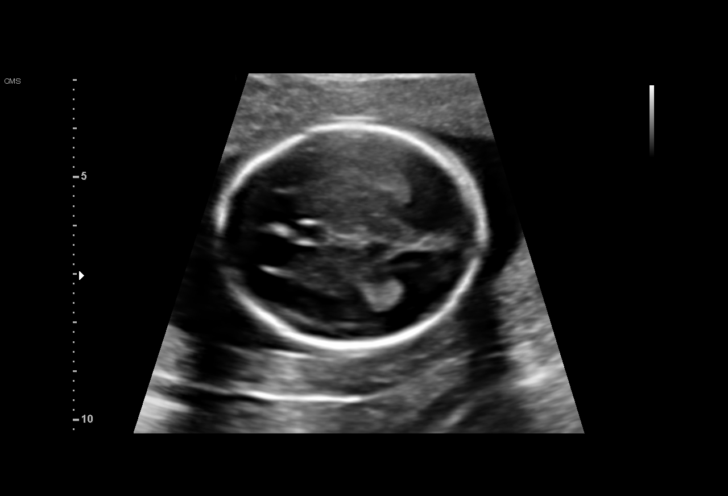
[im 22/66]
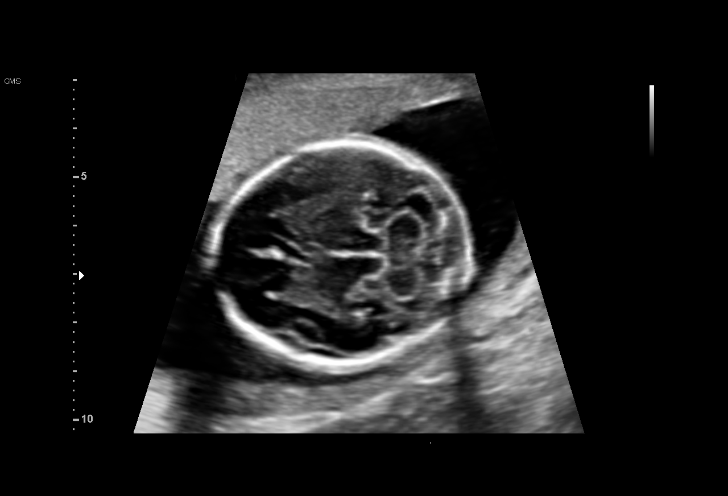
[im 27/66]
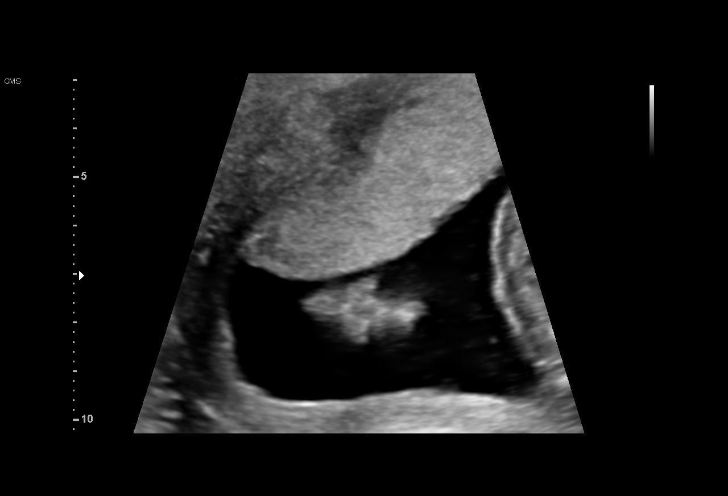
[im 32/66]
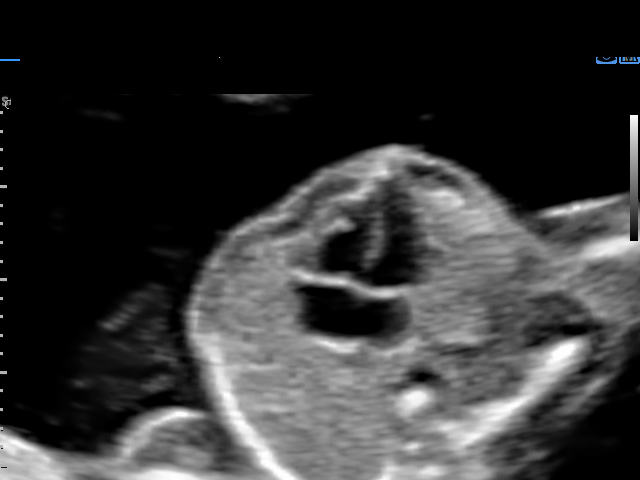
[im 37/66]
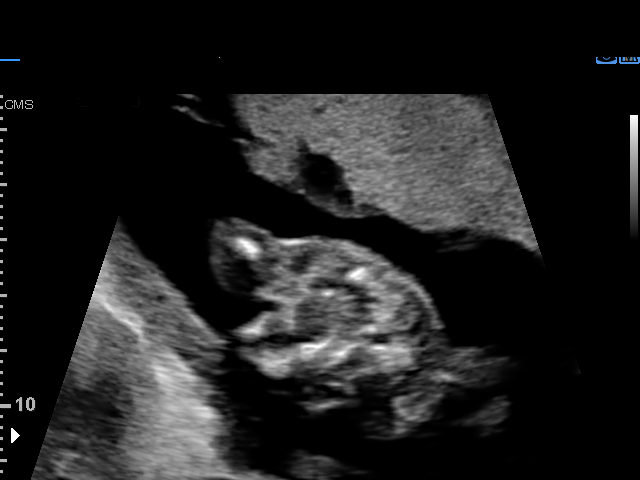
[im 41/66]
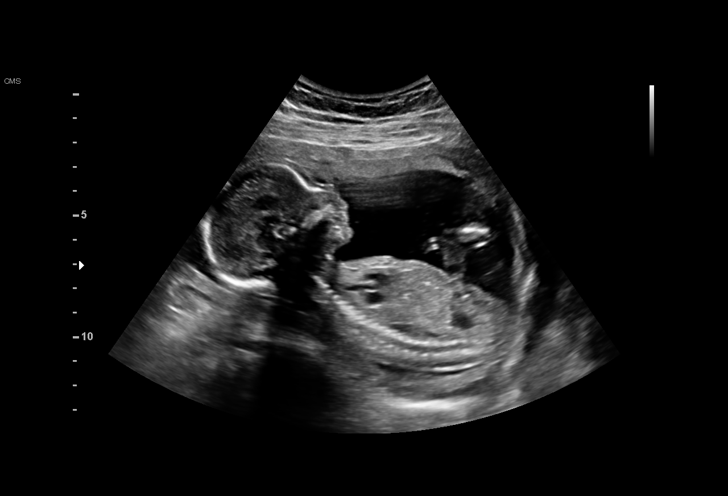
[im 46/66]
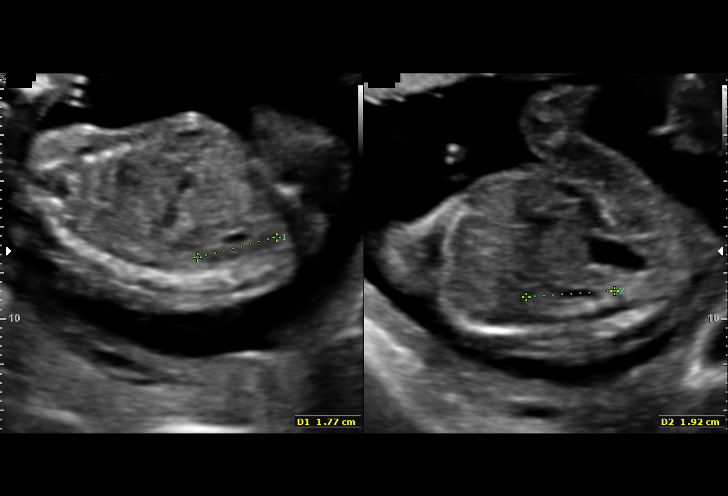
[im 51/66]
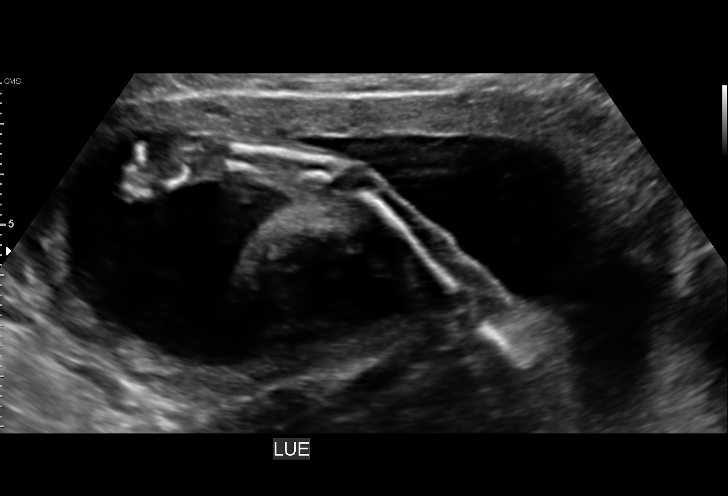
[im 56/66]
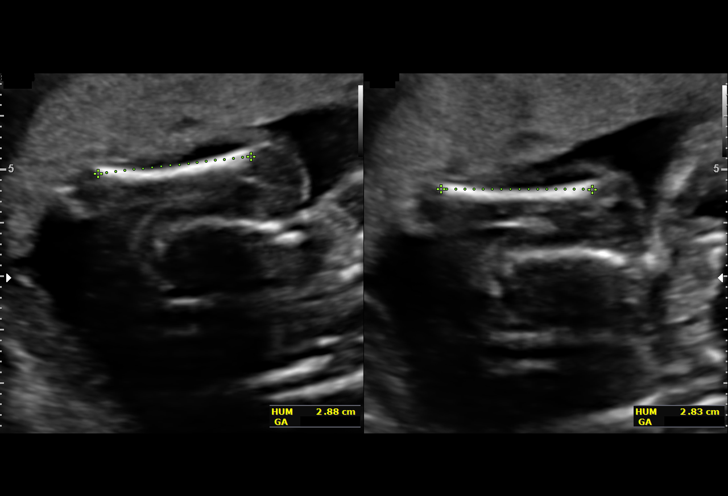
[im 61/66]
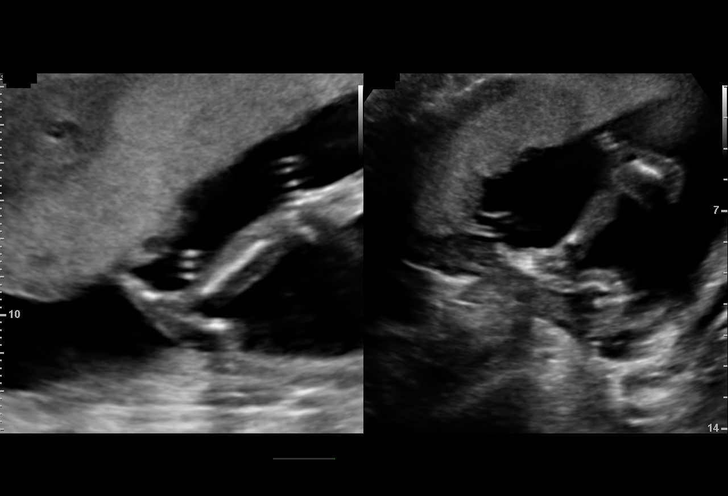
[im 66/66]
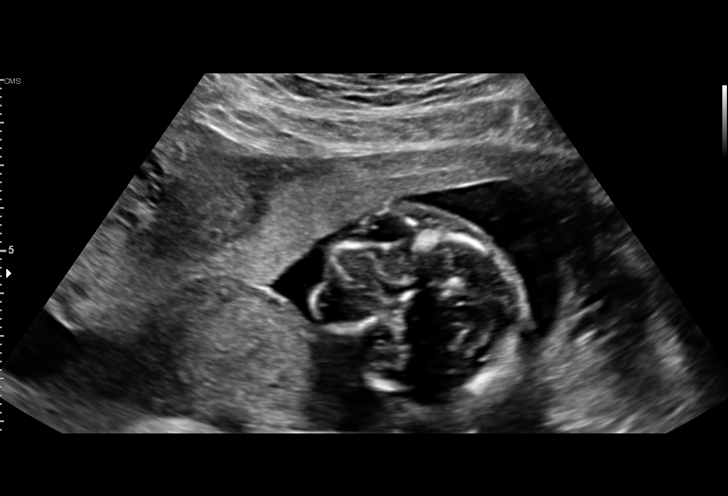

[14 of 28 positions shown; findings below may reference images not displayed]

[REDACTED]-
Faculty Physician
[facility]
Ref. Address:     [place]
[facility]

[address]                [id number]      [id number]     [id number]
[facility]
Indications

19 weeks gestation of pregnancy
Detailed fetal anatomic survey                 Z36
Advanced maternal age multigravida 35+,
second trimester; quad screen pending
OB History

Gravidity:    2         Term:   1        Prem:   0        SAB:   0
TOP:          0       Ectopic:  0        Living: 1
Fetal Evaluation

Num Of Fetuses:     1
Fetal Heart         154
Rate(bpm):
Cardiac Activity:   Observed
Presentation:       Transverse, head to maternal left
Placenta:           Anterior, above cervical os
P. Cord Insertion:  Visualized, central

Amniotic Fluid
AFI FV:      Subjectively within normal limits

Largest Pocket(cm)
4.35
Biometry

BPD:      45.1  mm     G. Age:  19w 4d         59  %    CI:         74.7   %   70 - 86
FL/HC:      16.4   %   16.1 -
HC:      165.6  mm     G. Age:  19w 2d         35  %    HC/AC:      1.20       1.09 -
AC:      138.4  mm     G. Age:  19w 2d         40  %    FL/BPD:     60.1   %
FL:       27.1  mm     G. Age:  18w 2d         10  %    FL/AC:      19.6   %   20 - 24
HUM:      28.5  mm     G. Age:  19w 1d         46  %
CER:      18.8  mm     G. Age:  18w 3d         22  %
NFT:         6  mm

CM:        4.1  mm

Est. FW:     261  gm      0 lb 9 oz     35  %
Gestational Age

LMP:           19w 3d       Date:   07/28/15                 EDD:   05/03/16
U/S Today:     19w 1d                                        EDD:   05/05/16
Best:          19w 3d    Det. By:   LMP  (07/28/15)          EDD:   05/03/16
Anatomy

Cranium:               Appears normal         Aortic Arch:            Appears normal
Cavum:                 Appears normal         Ductal Arch:            Appears normal
Ventricles:            Appears normal         Diaphragm:              Appears normal
Choroid Plexus:        Appears normal         Stomach:                Appears normal, left
sided
Cerebellum:            Appears normal         Abdomen:                Appears normal
Posterior Fossa:       Appears normal         Abdominal Wall:         Appears nml (cord
insert, abd wall)
Nuchal Fold:           Appears normal         Cord Vessels:           Appears normal (3
vessel cord)
Face:                  Appears normal         Kidneys:                Appear normal
(orbits and profile)
Lips:                  Appears normal         Bladder:                Appears normal
Thoracic:              Appears normal         Spine:                  Not well visualized
Heart:                 Appears normal         Upper Extremities:      Appears normal
(4CH, axis, and
situs)
RVOT:                  Appears normal         Lower Extremities:      Appears normal
LVOT:                  Appears normal

Other:  Fetus appears to be a male. Heels and 5th digit visualized. Nasal
bone visualized. Technically difficult due to fetal position.
Cervix Uterus Adnexa

Cervix
Length:            3.8  cm.
Normal appearance by transabdominal scan.

Uterus
No abnormality visualized.

Left Ovary
Within normal limits.

Right Ovary
Within normal limits.
Cul De Sac:   No free fluid seen.
Adnexa:       No abnormality visualized.
Impression

SIUP at 19+3 weeks
Normal detailed fetal anatomy; limited views of spine but no
gross abnormaltiies noted
Markers of aneuploidy: none
Normal amniotic fluid volume
Measurements consistent with LMP dating
Recommendations

Follow-up as clinically indicated

## 2018-06-04 ENCOUNTER — Emergency Department (HOSPITAL_COMMUNITY)
Admission: EM | Admit: 2018-06-04 | Discharge: 2018-06-04 | Disposition: A | Discharge status: Home / Self Care | Payer: BLUE CROSS/BLUE SHIELD | Attending: Emergency Medicine | Admitting: Emergency Medicine

## 2018-06-04 ENCOUNTER — Encounter (HOSPITAL_COMMUNITY): Payer: Self-pay

## 2018-06-04 ENCOUNTER — Other Ambulatory Visit: Payer: Self-pay

## 2018-06-04 DIAGNOSIS — R05 Cough: Secondary | ICD-10-CM | POA: Insufficient documentation

## 2018-06-04 DIAGNOSIS — J029 Acute pharyngitis, unspecified: Secondary | ICD-10-CM | POA: Diagnosis present

## 2018-06-04 DIAGNOSIS — R0981 Nasal congestion: Secondary | ICD-10-CM | POA: Insufficient documentation

## 2018-06-04 LAB — GROUP A STREP BY PCR: Group A Strep by PCR: NOT DETECTED

## 2018-06-04 MED ORDER — NAPROXEN 375 MG PO TABS: 375.0000 mg | ORAL_TABLET | Freq: Two times a day (BID) | ORAL | 0 refills | Status: AC

## 2018-06-04 MED ORDER — IBUPROFEN 400 MG PO TABS
600.0000 mg | ORAL_TABLET | Freq: Once | ORAL | Status: AC
  Administered 2018-06-04: 13:00:00 600 mg via ORAL
  Filled 2018-06-04: qty 1

## 2018-06-04 NOTE — ED Provider Notes (Signed)
[facility] EMERGENCY DEPARTMENT Provider Note   CSN: [id number] Arrival date & time: 06/04/18  1229    History   Chief Complaint Chief Complaint  Patient presents with  . Sore Throat    HPI Roberta Valenzuela is a 38 y.o. female.     Patient is a 38 year old female who presents with a 2-day history of sore throat.  She says it hurts to swallow.  She is had some intermittent fevers.  She has some rhinorrhea and nasal congestion as well as some coughing.  No shortness of breath other than during some coughing spells.  No vomiting.  No difficulty controlling her secretions.  She has been using some topical spray and Tylenol with some improvement in symptoms.     Past Medical History:  Diagnosis Date  . Lipoma   . Medical history non-contributory     Patient Active Problem List   Diagnosis Date Noted  . Thrombocytopenia affecting pregnancy ([place]) 05/11/2016  . Post term pregnancy 05/11/2016  . SVD (spontaneous vaginal delivery) 06/26/2013    Past Surgical History:  Procedure Laterality Date  . NO PAST SURGERIES       OB History    Gravida  2   Para  2   Term  2   Preterm      AB      Living  2     SAB      TAB      Ectopic      Multiple  0   Live Births  2            Home Medications    Prior to Admission medications   Medication Sig Start Date End Date Taking? Authorizing Provider  acetaminophen (TYLENOL) 325 MG tablet Take 650 mg by mouth every 6 (six) hours as needed for mild pain or headache.    [provider]  naproxen (NAPROSYN) 375 MG tablet Take 1 tablet (375 mg total) by mouth 2 (two) times daily. 06/04/18   Malvin Johns, MD  Prenatal Vit-Fe Fumarate-FA (PRENATAL VITAMIN PO) Take 1 tablet by mouth daily.     [provider]  TAMIFLU 75 MG capsule Take 75 mg by mouth 2 (two) times daily. 05/07/16   [provider]    Family History Family History  Problem Relation Age of Onset  . Diabetes  Mother     Social History Social History   Tobacco Use  . Smoking status: Never Smoker  . Smokeless tobacco: Never Used  Substance Use Topics  . Alcohol use: No  . Drug use: No     Allergies   Patient has no known allergies.   Review of Systems Review of Systems  Constitutional: Positive for fatigue and fever. Negative for chills and diaphoresis.  HENT: Positive for congestion, rhinorrhea and sore throat. Negative for sneezing.   Eyes: Negative.   Respiratory: Positive for cough. Negative for chest tightness and shortness of breath.   Cardiovascular: Negative for chest pain and leg swelling.  Gastrointestinal: Negative for abdominal pain, blood in stool, diarrhea, nausea and vomiting.  Genitourinary: Negative for difficulty urinating, flank pain, frequency and hematuria.  Musculoskeletal: Negative for arthralgias and back pain.  Skin: Negative for rash.  Neurological: Negative for dizziness, speech difficulty, weakness, numbness and headaches.     Physical Exam Updated Vital Signs BP (!) 123/93 (BP Location: Right Arm)   Pulse 94   Temp 98 F (36.7 C) (Oral)   Resp (!) 21  Ht 5\' 3"  (1.6 m)   Wt 73.9 kg   SpO2 98%   BMI 28.87 kg/m   Physical Exam Constitutional:      Appearance: She is well-developed.  HENT:     Head: Normocephalic and atraumatic.     Right Ear: Tympanic membrane normal.     Left Ear: Tympanic membrane normal.     Nose: Rhinorrhea present.     Comments: Mild erythema to the posterior pharynx, uvula is midline, no trismus, no peritonsillar fullness, no exudates    Mouth/Throat:     Pharynx: Uvula midline.  Eyes:     Pupils: Pupils are equal, round, and reactive to light.  Neck:     Musculoskeletal: Normal range of motion and neck supple.  Cardiovascular:     Rate and Rhythm: Normal rate and regular rhythm.     Heart sounds: Normal heart sounds.  Pulmonary:     Effort: Pulmonary effort is normal. No respiratory distress.     Breath  sounds: Normal breath sounds. No wheezing or rales.  Chest:     Chest wall: No tenderness.  Abdominal:     General: Bowel sounds are normal.     Palpations: Abdomen is soft.     Tenderness: There is no abdominal tenderness. There is no guarding or rebound.  Musculoskeletal: Normal range of motion.  Lymphadenopathy:     Cervical: No cervical adenopathy.  Skin:    General: Skin is warm and dry.     Findings: No rash.  Neurological:     Mental Status: She is alert and oriented to person, place, and time.      ED Treatments / Results  Labs (all labs ordered are listed, but only abnormal results are displayed) Labs Reviewed  GROUP A STREP BY PCR    EKG None  Radiology No results found.  Procedures Procedures (including critical care time)  Medications Ordered in ED Medications  ibuprofen (ADVIL,MOTRIN) tablet 600 mg (600 mg Oral Given 06/04/18 1248)     Initial Impression / Assessment and Plan / ED Course  I have reviewed the triage vital signs and the nursing notes.  Pertinent labs & imaging results that were available during my care of the patient were reviewed by me and considered in my medical decision making (see chart for details).        Patient is a 38 year old female who presents with URI symptoms and sore throat.  Her lungs are clear without suggestions of pneumonia.  Her strep test is negative.  Her throat exam is benign.  There is no significant swelling.  No airway compromise.  No difficulty swallowing her secretions.  She was discharged home in good condition.  She was advised that this is likely viral in nature and was advised in symptomatic care.  She was given a prescription for Naprosyn.  She does report that she is currently on her menstrual cycle now and has not missed any menstrual cycles or does not have any concern that she may be pregnant.  Return precautions were given.  Final Clinical Impressions(s) / ED Diagnoses   Final diagnoses:  Viral  pharyngitis    ED Discharge Orders         Ordered    naproxen (NAPROSYN) 375 MG tablet  2 times daily     06/04/18 1346           Malvin Johns, MD 06/04/18 1348

## 2018-06-04 NOTE — ED Triage Notes (Signed)
C/o sore throat since yesterday, painful to swallow, fever comes & goes (per pt).

## 2020-02-07 ENCOUNTER — Emergency Department (HOSPITAL_COMMUNITY)
Admission: EM | Admit: 2020-02-07 | Discharge: 2020-02-07 | Disposition: A | Discharge status: Home / Self Care | Payer: Self-pay | Attending: Emergency Medicine | Admitting: Emergency Medicine

## 2020-02-07 ENCOUNTER — Encounter (HOSPITAL_COMMUNITY): Payer: Self-pay

## 2020-02-07 ENCOUNTER — Other Ambulatory Visit: Payer: Self-pay

## 2020-02-07 DIAGNOSIS — J020 Streptococcal pharyngitis: Secondary | ICD-10-CM | POA: Insufficient documentation

## 2020-02-07 DIAGNOSIS — Z20822 Contact with and (suspected) exposure to covid-19: Secondary | ICD-10-CM | POA: Insufficient documentation

## 2020-02-07 LAB — RESPIRATORY PANEL BY RT PCR (FLU A&B, COVID)
Influenza A by PCR: NEGATIVE
Influenza B by PCR: NEGATIVE
SARS Coronavirus 2 by RT PCR: NEGATIVE

## 2020-02-07 LAB — GROUP A STREP BY PCR: Group A Strep by PCR: DETECTED — AB

## 2020-02-07 MED ORDER — CLINDAMYCIN HCL 300 MG PO CAPS: 300.0000 mg | ORAL_CAPSULE | Freq: Four times a day (QID) | ORAL | 0 refills | Status: AC

## 2020-02-07 NOTE — ED Triage Notes (Signed)
Pt presents with sore throat starting last night. Pt denies aany other symptoms.

## 2020-02-07 NOTE — ED Provider Notes (Signed)
[facility] Emergency Department Provider Note MRN:  [id number]  Arrival date & time: 02/07/20     Chief Complaint   Sore Throat   History of Present Illness   Roberta Valenzuela is a 39 y.o. year-old female with no pertinent past medical history presenting to the ED with chief complaint of sore throat.  2 days of persistent sore throat, right side hurts worse than the left.  Worse with swallowing.  Symptoms are moderate, constant.  Associated with cough.  Denies fever, no other complaints.  Review of Systems  A problem-focused ROS was performed. Positive for sore throat, cough.  Patient denies fever.  Patient's Health History    Past Medical History:  Diagnosis Date   Lipoma    Medical history non-contributory     Past Surgical History:  Procedure Laterality Date   NO PAST SURGERIES      Family History  Problem Relation Age of Onset   Diabetes Mother     Social History   Socioeconomic History   Marital status: Married    Spouse name: Not on file   Number of children: Not on file   Years of education: Not on file   Highest education level: Not on file  Occupational History   Not on file  Tobacco Use   Smoking status: Never Smoker   Smokeless tobacco: Never Used  Substance and Sexual Activity   Alcohol use: No   Drug use: No   Sexual activity: Yes  Other Topics Concern   Not on file  Social History Narrative   Not on file   Social Determinants of Health   Financial Resource Strain:    Difficulty of Paying Living Expenses: Not on file  Food Insecurity:    Worried About [place] in the Last Year: Not on file   Ran Out of Food in the Last Year: Not on file  Transportation Needs:    Lack of Transportation (Medical): Not on file   Lack of Transportation (Non-Medical): Not on file  Physical Activity:    Days of Exercise per Week: Not on file   Minutes of Exercise per Session: Not on file  Stress:     Feeling of Stress : Not on file  Social Connections:    Frequency of Communication with Friends and Family: Not on file   Frequency of Social Gatherings with Friends and Family: Not on file   Attends Religious Services: Not on file   Active Member of [place] or Organizations: Not on file   Attends Archivist Meetings: Not on file   Marital Status: Not on file  Intimate Partner Violence:    Fear of Current or Ex-Partner: Not on file   Emotionally Abused: Not on file   Physically Abused: Not on file   Sexually Abused: Not on file     Physical Exam   Vitals:   02/07/20 1624  BP: 113/63  Pulse: 91  Resp: 14  Temp: 98.4 F (36.9 C)  SpO2: 100%    CONSTITUTIONAL: Well-appearing, NAD NEURO:  Alert and oriented x 3, no focal deficits EYES:  eyes equal and reactive ENT/NECK:  no LAD, no JVD; erythema to the posterior oropharynx and tonsils with slightly increased erythema on the right peritonsillar region CARDIO: Regular rate, well-perfused, normal S1 and S2 PULM:  CTAB no wheezing or rhonchi GI/GU:  normal bowel sounds, non-distended, non-tender MSK/SPINE:  No gross deformities, no edema SKIN:  no rash, atraumatic PSYCH:  Appropriate  speech and behavior  *Additional and/or pertinent findings included in MDM below  Diagnostic and Interventional Summary    EKG Interpretation  Date/Time:    Ventricular Rate:    PR Interval:    QRS Duration:   QT Interval:    QTC Calculation:   R Axis:     Text Interpretation:        Labs Reviewed  GROUP A STREP BY PCR - Abnormal; Notable for the following components:      Result Value   Group A Strep by PCR DETECTED (*)    All other components within normal limits  RESPIRATORY PANEL BY RT PCR (FLU A&B, COVID)    No orders to display    Medications - No data to display   Procedures  /  Critical Care Procedures  ED Course and Medical Decision Making  I have reviewed the triage vital signs, the nursing notes,  and pertinent available records from the EMR.  Listed above are laboratory and imaging tests that I personally ordered, reviewed, and interpreted and then considered in my medical decision making (see below for details).  Patient is tested positive for strep throat, on exam there is some slight asymmetry and so considering a very early or small peritonsillar abscess.  Normal phonation, normal motion of the neck, nothing to suggest more sinister deep space neck infection.  Given the very mild nature of the exam, and the literature that would suggest that peritonsillar abscess can be treated safely without I&D, will trial clindamycin and advise strict return precautions if not improving.  Patient agreeable with this plan.       Barth Kirks. Sedonia Small, [place] [email or web address]  Final Clinical Impressions(s) / ED Diagnoses     ICD-10-CM   1. Strep throat  J02.0     ED Discharge Orders         Ordered    clindamycin (CLEOCIN) 300 MG capsule  4 times daily        02/07/20 2025           Discharge Instructions Discussed with and Provided to Patient:     Discharge Instructions     You were evaluated in the Emergency Department and after careful evaluation, we did not find any emergent condition requiring admission or further testing in the hospital.  Your exam/testing today is overall reassuring.  Your symptoms seem to be due to strep throat.  Please take the antibiotics as directed and follow-up with your regular doctor.  Please return to the Emergency Department if you experience any worsening of your condition.   Thank you for allowing [place] to be a part of your care.      Maudie Flakes, MD 02/07/20 2029

## 2020-02-07 NOTE — ED Notes (Signed)
Pts husband stated that they were no lo ger waiting

## 2020-02-07 NOTE — Discharge Instructions (Addendum)
You were evaluated in the Emergency Department and after careful evaluation, we did not find any emergent condition requiring admission or further testing in the hospital.  Your exam/testing today is overall reassuring.  Your symptoms seem to be due to strep throat.  Please take the antibiotics as directed and follow-up with your regular doctor.  Please return to the Emergency Department if you experience any worsening of your condition.   Thank you for allowing [place] to be a part of your care.
